# Patient Record
Sex: Female | Born: 1940 | Race: White | Hispanic: No | Marital: Married
Health system: Southern US, Academic
[De-identification: ages and names within clinical notes are randomized; demographics above are authoritative.]

## PROBLEM LIST (undated history)

## (undated) ENCOUNTER — Telehealth

## (undated) ENCOUNTER — Encounter

## (undated) ENCOUNTER — Ambulatory Visit

## (undated) ENCOUNTER — Ambulatory Visit: Payer: MEDICARE | Attending: Family | Primary: Family

## (undated) ENCOUNTER — Encounter: Attending: Family | Primary: Family

## (undated) ENCOUNTER — Ambulatory Visit: Payer: MEDICARE

## (undated) ENCOUNTER — Institutional Professional Consult (permissible substitution): Payer: MEDICARE | Attending: Family | Primary: Family

## (undated) ENCOUNTER — Telehealth: Attending: Family | Primary: Family

## (undated) ENCOUNTER — Encounter: Attending: Hematology & Oncology | Primary: Hematology & Oncology

## (undated) ENCOUNTER — Institutional Professional Consult (permissible substitution): Payer: MEDICARE

## (undated) ENCOUNTER — Telehealth
Attending: Pharmacist Clinician (PhC)/ Clinical Pharmacy Specialist | Primary: Pharmacist Clinician (PhC)/ Clinical Pharmacy Specialist

## (undated) DIAGNOSIS — G459 Transient cerebral ischemic attack, unspecified: Secondary | ICD-10-CM

## (undated) DIAGNOSIS — T753XXA Motion sickness, initial encounter: Secondary | ICD-10-CM

## (undated) DIAGNOSIS — R42 Dizziness and giddiness: Secondary | ICD-10-CM

## (undated) DIAGNOSIS — M1990 Unspecified osteoarthritis, unspecified site: Secondary | ICD-10-CM

## (undated) DIAGNOSIS — K759 Inflammatory liver disease, unspecified: Secondary | ICD-10-CM

## (undated) DIAGNOSIS — Z972 Presence of dental prosthetic device (complete) (partial): Secondary | ICD-10-CM

## (undated) DIAGNOSIS — I10 Essential (primary) hypertension: Secondary | ICD-10-CM

## (undated) HISTORY — PX: ABDOMINAL HYSTERECTOMY: SHX81

## (undated) HISTORY — PX: TONSILLECTOMY: SUR1361

## (undated) MED ORDER — CHOLECALCIFEROL (VITAMIN D3) 50 MCG (2,000 UNIT) CAPSULE: ORAL | 0 days

## (undated) MED ORDER — CETIRIZINE 10 MG TABLET: Freq: Every day | ORAL | 0 days

## (undated) MED ORDER — IBUPROFEN 600 MG TABLET: Freq: Two times a day (BID) | ORAL | 0 days

---
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
## 2007-03-03 ENCOUNTER — Ambulatory Visit: Payer: Self-pay | Admitting: Family Medicine

## 2007-07-30 ENCOUNTER — Ambulatory Visit: Payer: Self-pay | Admitting: Family Medicine

## 2009-12-21 ENCOUNTER — Emergency Department: Payer: Self-pay | Admitting: Emergency Medicine

## 2011-11-11 ENCOUNTER — Emergency Department: Payer: Self-pay | Admitting: Emergency Medicine

## 2011-11-11 LAB — URINALYSIS, COMPLETE
Blood: NEGATIVE
Glucose,UR: NEGATIVE mg/dL (ref 0–75)
Leukocyte Esterase: NEGATIVE
Nitrite: NEGATIVE
Protein: 100
WBC UR: 4 /HPF (ref 0–5)

## 2011-11-11 LAB — COMPREHENSIVE METABOLIC PANEL
Alkaline Phosphatase: 48 U/L — ABNORMAL LOW (ref 50–136)
Bilirubin,Total: 0.5 mg/dL (ref 0.2–1.0)
Co2: 23 mmol/L (ref 21–32)
Creatinine: 0.57 mg/dL — ABNORMAL LOW (ref 0.60–1.30)
EGFR (African American): >60
EGFR (Non-African Amer.): >60
SGOT(AST): 49 U/L — ABNORMAL HIGH (ref 15–37)
SGPT (ALT): 56 U/L (ref 12–78)
Sodium: 139 mmol/L (ref 136–145)
Total Protein: 7 g/dL (ref 6.4–8.2)

## 2011-11-11 LAB — CBC
HCT: 44.6 % (ref 35.0–47.0)
MCH: 33.5 pg (ref 26.0–34.0)
MCV: 98 fL (ref 80–100)
Platelet: 229 10*3/uL (ref 150–440)
RBC: 4.57 10*6/uL (ref 3.80–5.20)
RDW: 13.9 % (ref 11.5–14.5)

## 2011-11-11 LAB — TROPONIN I: Troponin-I: <0.02 ng/mL

## 2013-03-15 ENCOUNTER — Emergency Department: Payer: Self-pay | Admitting: Emergency Medicine

## 2013-03-15 LAB — CBC
HCT: 51.6 % — ABNORMAL HIGH (ref 35.0–47.0)
HGB: 16.7 g/dL — ABNORMAL HIGH (ref 12.0–16.0)
MCH: 31.1 pg (ref 26.0–34.0)
MCHC: 32.3 g/dL (ref 32.0–36.0)
Platelet: 197 10*3/uL (ref 150–440)
RBC: 5.37 10*6/uL — ABNORMAL HIGH (ref 3.80–5.20)
RDW: 15.4 % — ABNORMAL HIGH (ref 11.5–14.5)

## 2013-03-15 LAB — BASIC METABOLIC PANEL
Anion Gap: 10 (ref 7–16)
BUN: 10 mg/dL (ref 7–18)
Calcium, Total: 9.2 mg/dL (ref 8.5–10.1)
Chloride: 106 mmol/L (ref 98–107)
Co2: 24 mmol/L (ref 21–32)
Creatinine: 0.54 mg/dL — ABNORMAL LOW (ref 0.60–1.30)
EGFR (African American): >60
Osmolality: 278 (ref 275–301)
Potassium: 4 mmol/L (ref 3.5–5.1)

## 2013-03-23 IMAGING — CT CT ABD-PELV W/ CM
1 of 2 series · 15 of 32 positions shown, 19 images · non-contrast
Comparison: none

REASON FOR EXAM: (1) pain, vomiting; (2) pain, vomiting
COMMENTS:

PROCEDURE:     CT  - CT ABDOMEN / PELVIS  W  - November 12, 2011  [DATE]
RESULT:
TECHNIQUE: Helical 3 mm sections were obtained from the lung bases through
the pubic symphysis.

[Series 2: 3mm soft tissue · axial · 0.73mm/px · z∈[-1003,-580]mm · 15 of 155 slices shown, 19 images]
[im 7/155  soft-tissue]
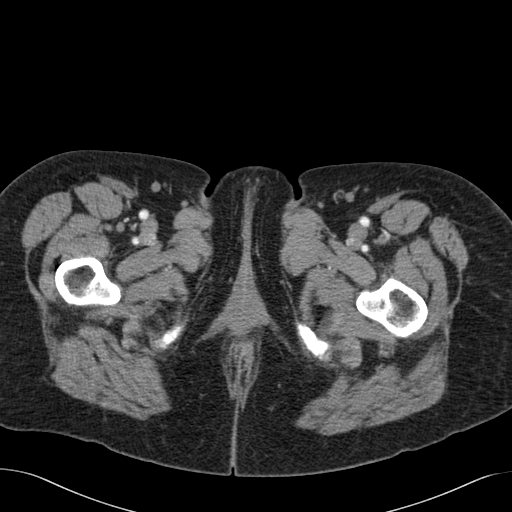
[im 7/155  bone]
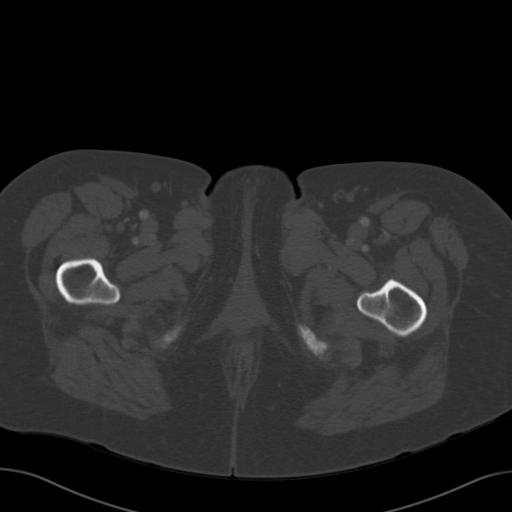
[im 21/155  soft-tissue]
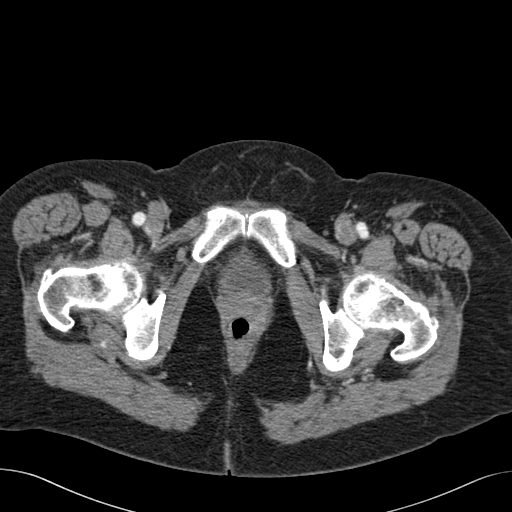
[im 34/155  soft-tissue]
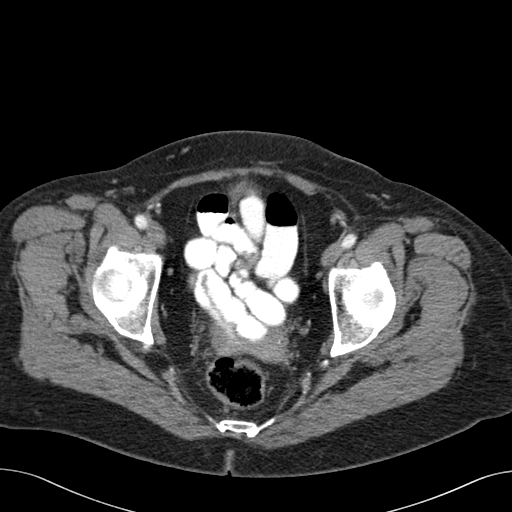
[im 41/155  soft-tissue]
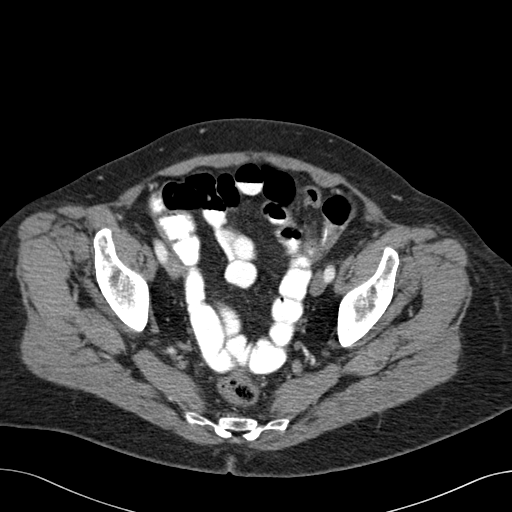
[im 54/155  soft-tissue]
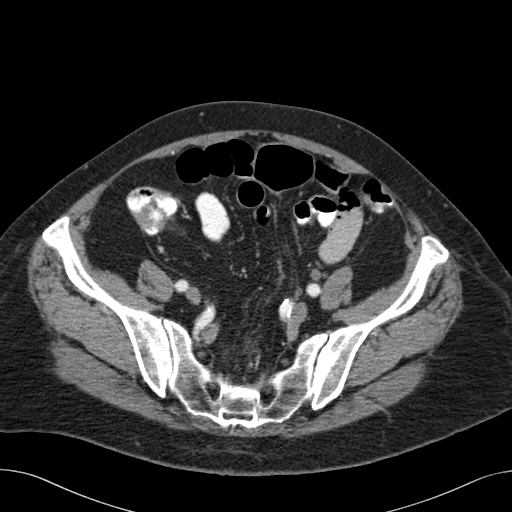
[im 67/155  soft-tissue]
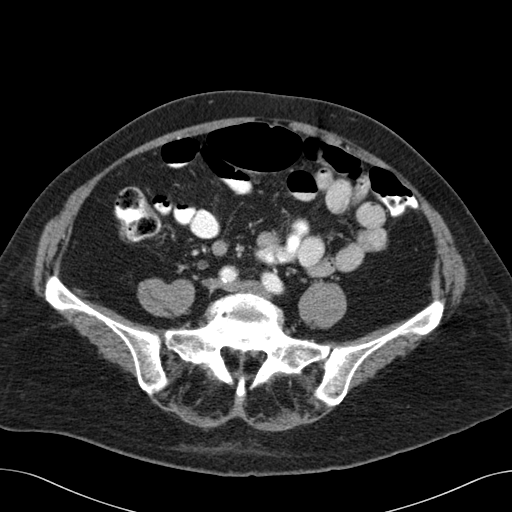
[im 81/155  soft-tissue]
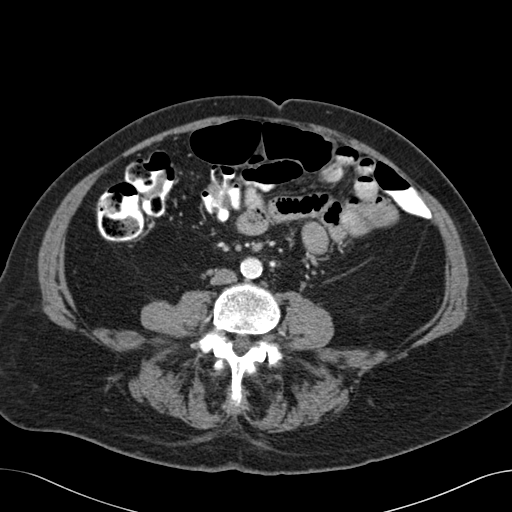
[im 88/155  soft-tissue]
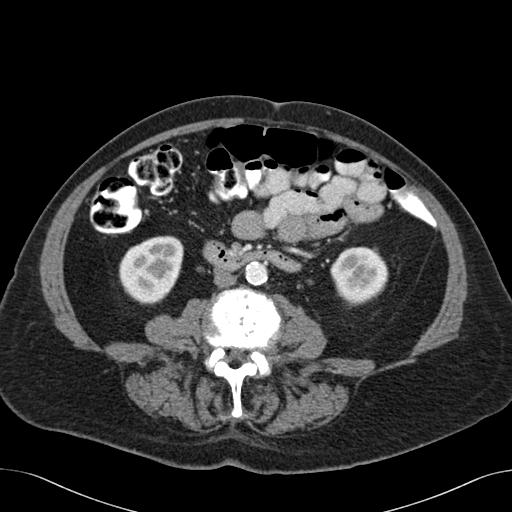
[im 101/155  soft-tissue]
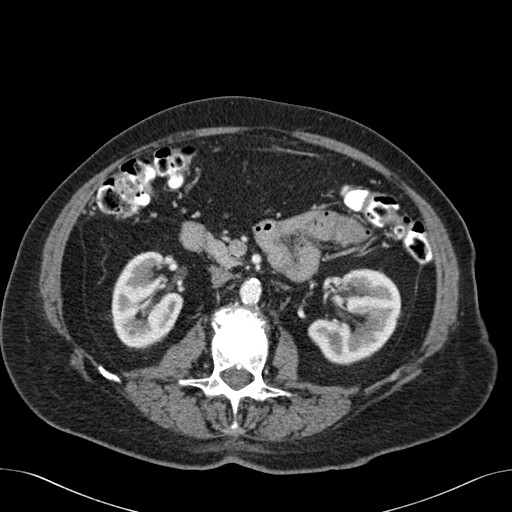
[im 101/155  bone]
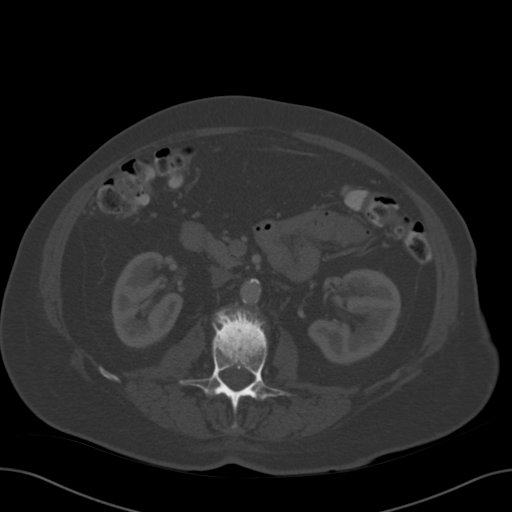
[im 114/155  soft-tissue]
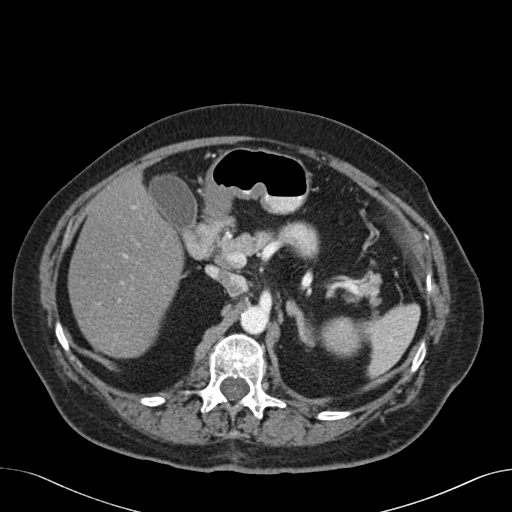
[im 121/155  soft-tissue]
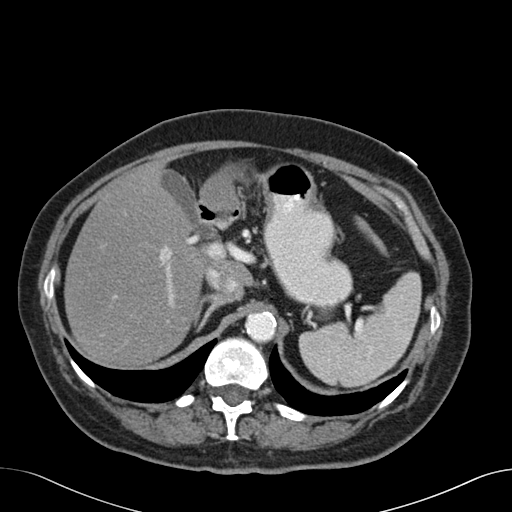
[im 128/155  lung]
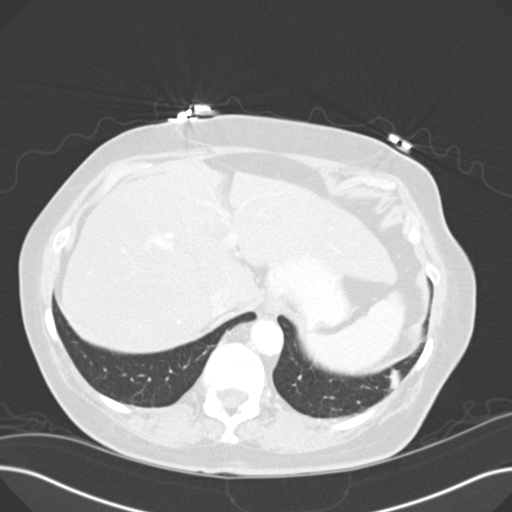
[im 134/155  soft-tissue]
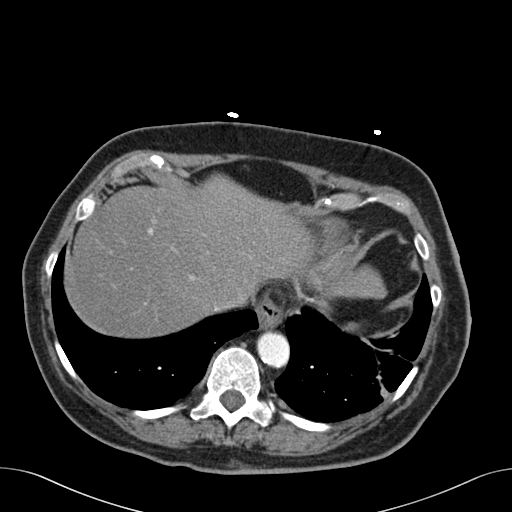
[im 134/155  lung]
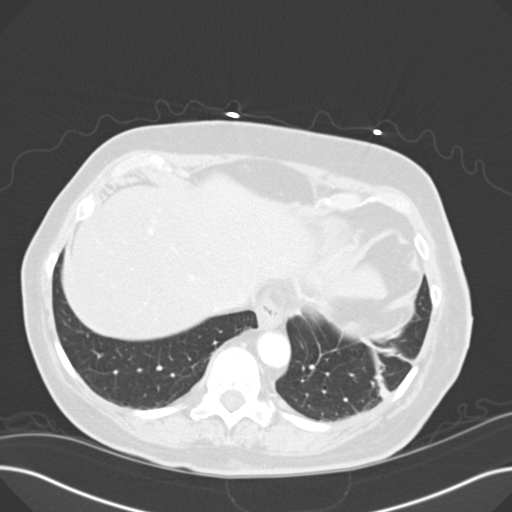
[im 141/155  lung]
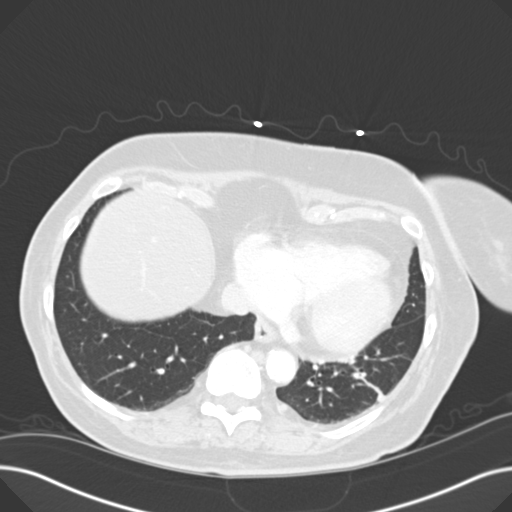
[im 148/155  soft-tissue]
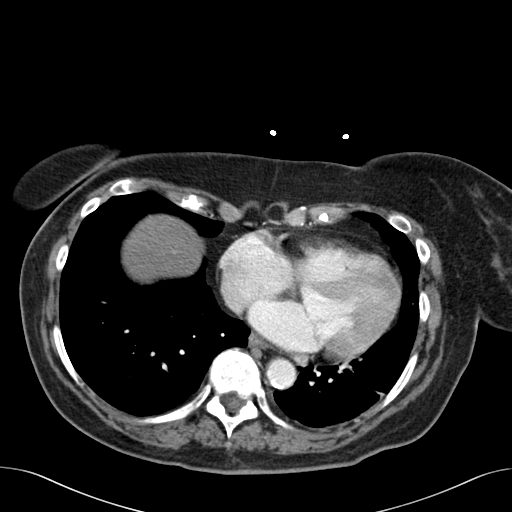
[im 148/155  lung]
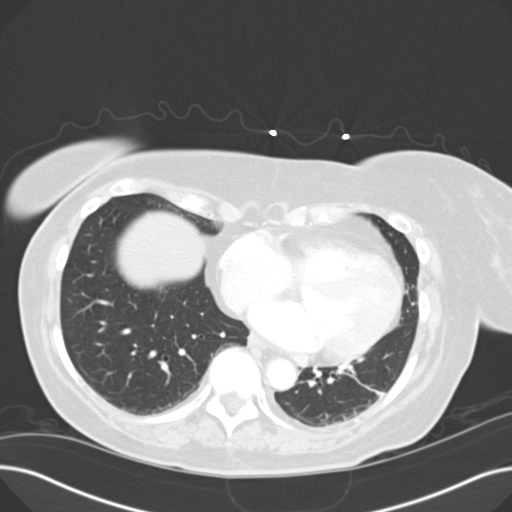

[15 of 32 positions shown; findings below may reference images not displayed]

FINDINGS: Evaluation of the lung bases demonstrates linear areas of
increased density likely representing areas of atelectasis and/or fibrosis.

There is diffuse low attenuation within the liver and is otherwise
unremarkable. The spleen, adrenals, pancreas, and kidneys are unremarkable.

Evaluation of the abdomen demonstrates diffuse wall thickening of the
gastric antrum. There does not appear to be significant stranding within the
surrounding mesenteric fat or evidence of a focal mass.

There is no CT evidence of bowel obstruction, enteritis, colitis,
diverticulitis or appendicitis. There is no evidence of an abdominal aortic
aneurysm. The celiac, SMA, IMA, portal vein, and SMV are opacified.
IMPRESSION: 1.  Findings which may represent the sequela of gastritis involving the
gastric antral region. Further evaluation with direct visualization is
recommended, if and as clinically warranted, status post appropriate
therapeutic regiment.
2.  Small hiatal hernia.
3.  There is otherwise no evidence of further obstructive or inflammatory
abnormalities.
4.  Dr. Rksras of the Emergency Department was informed of these findings
via a preliminary faxed report.

## 2013-09-10 MED ORDER — FOSINOPRIL 10 MG TABLET
Freq: Every day | ORAL | 0.00000 days
Start: 2013-09-10 — End: 2020-11-02

## 2014-07-25 IMAGING — CT CT HEAD WITHOUT CONTRAST
1 series · 16 of 30 positions shown, 20 images · non-contrast
Comparison: 11/11/2011.

CLINICAL DATA: Pain.  Headache.

EXAM:
CT HEAD WITHOUT CONTRAST
TECHNIQUE: Contiguous axial images were obtained from the base of the skull
through the vertex without intravenous contrast.

[Series 2: head wo · axial · 0.43mm/px · z∈[-60,+75]mm · 16 of 34 slices shown, 20 images]
[im 2/34  brain]
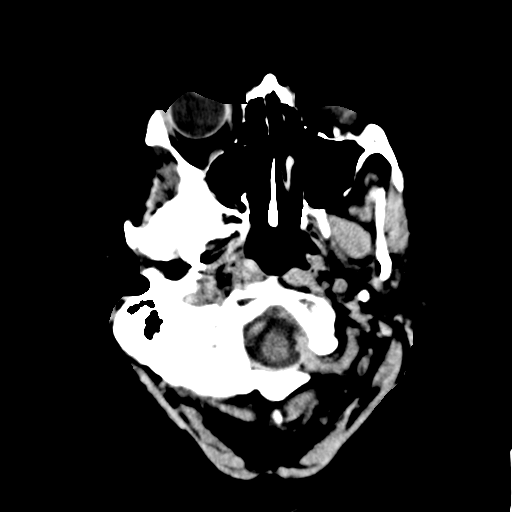
[im 2/34  bone]
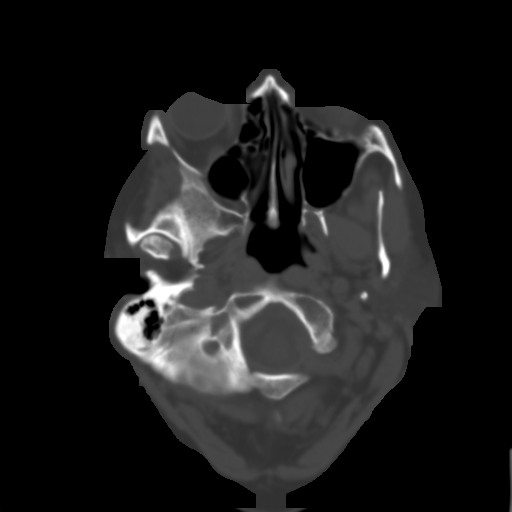
[im 4/34  brain]
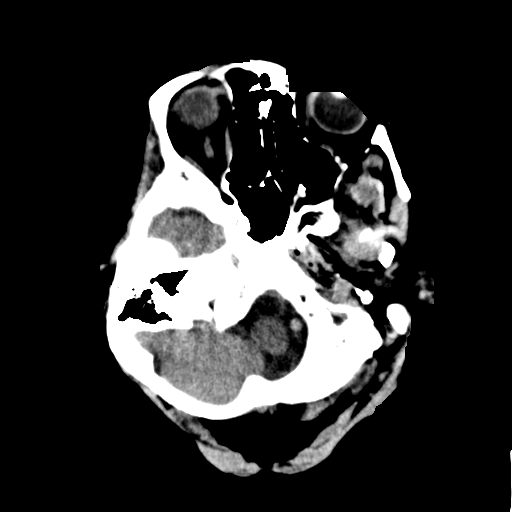
[im 6/34  brain]
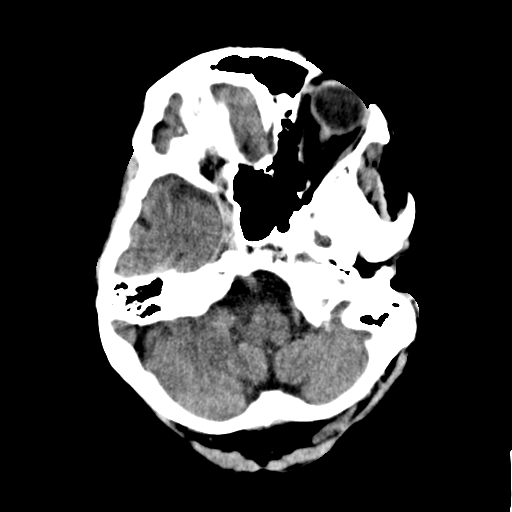
[im 8/34  brain]
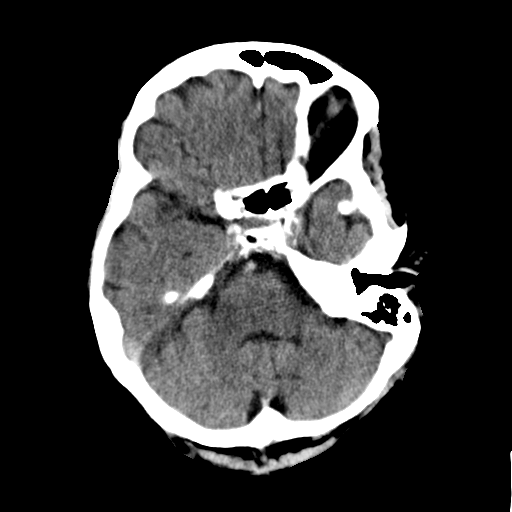
[im 10/34  brain]
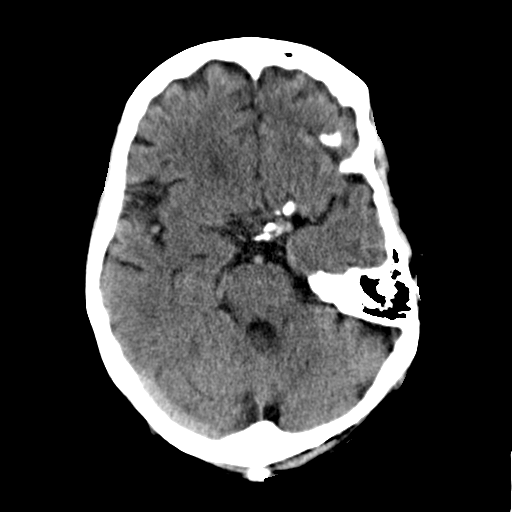
[im 10/34  bone]
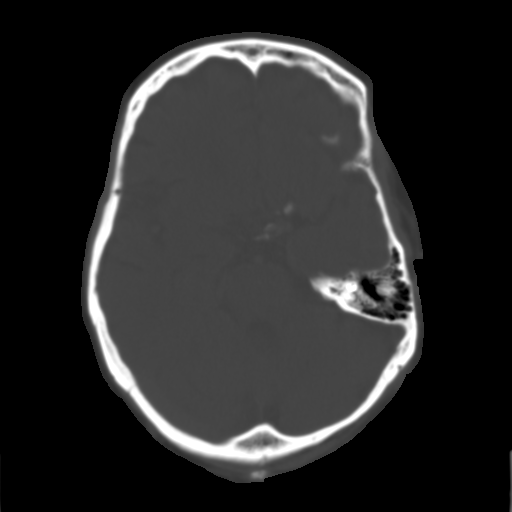
[im 12/34  brain]
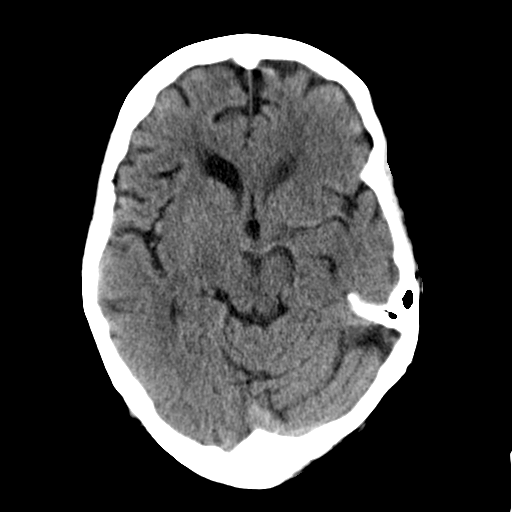
[im 14/34  brain]
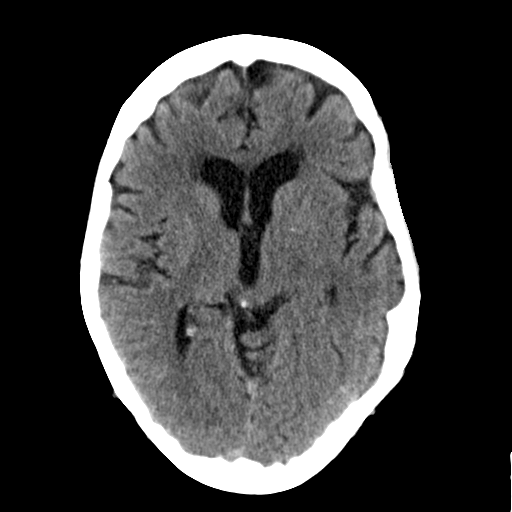
[im 16/34  brain]
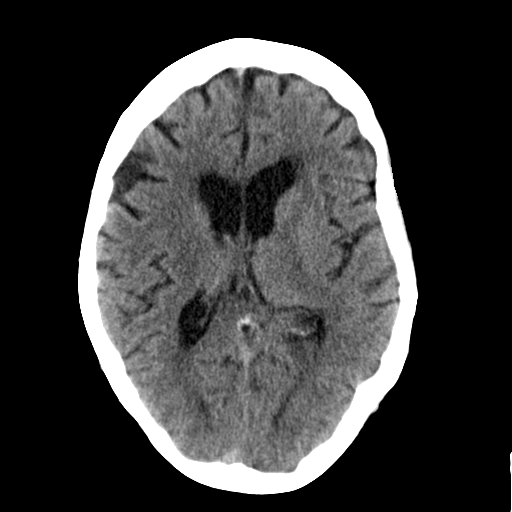
[im 18/34  brain]
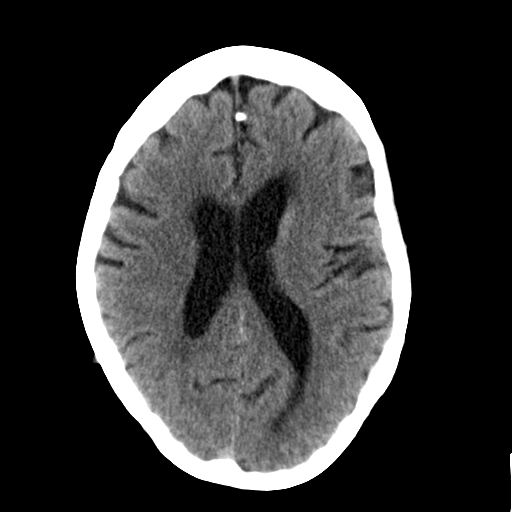
[im 18/34  bone]
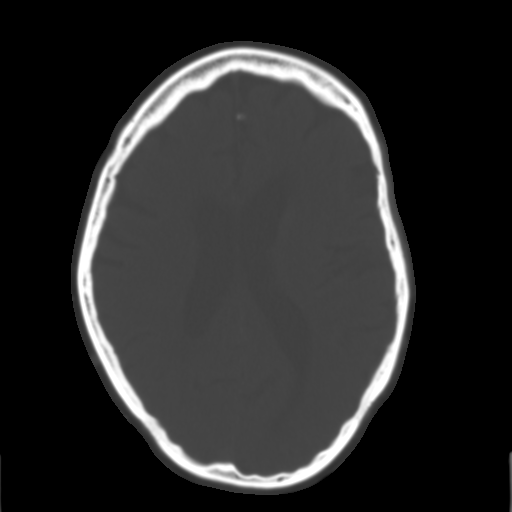
[im 20/34  brain]
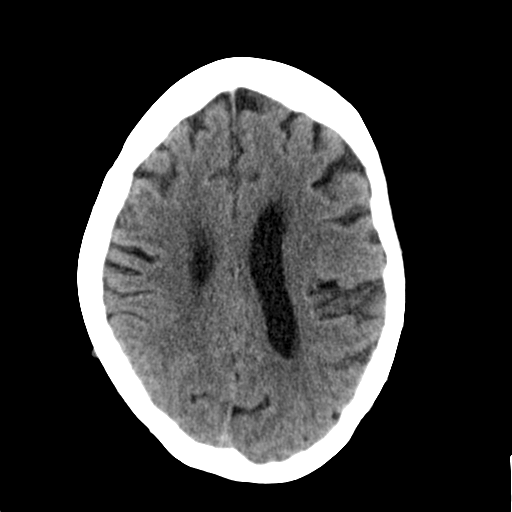
[im 22/34  brain]
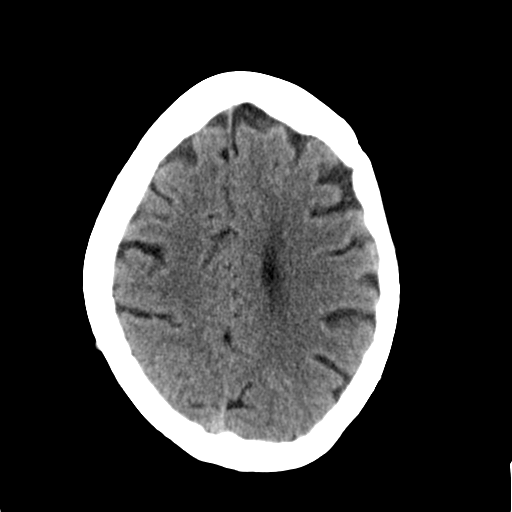
[im 24/34  brain]
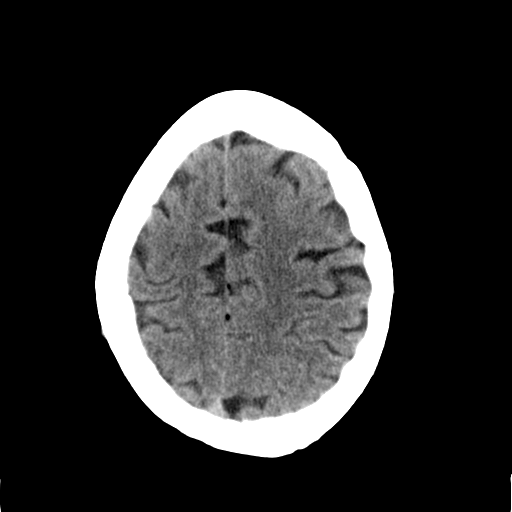
[im 26/34  brain]
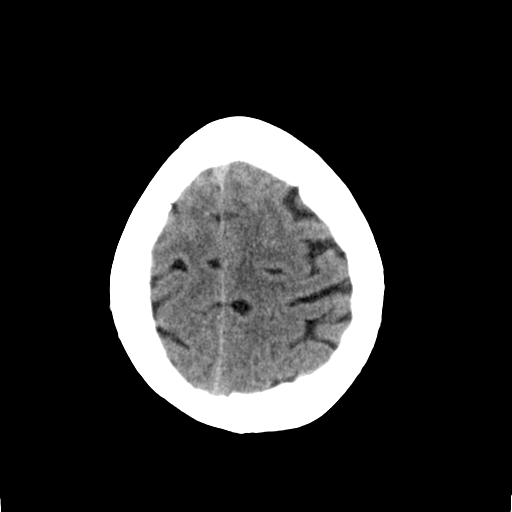
[im 26/34  bone]
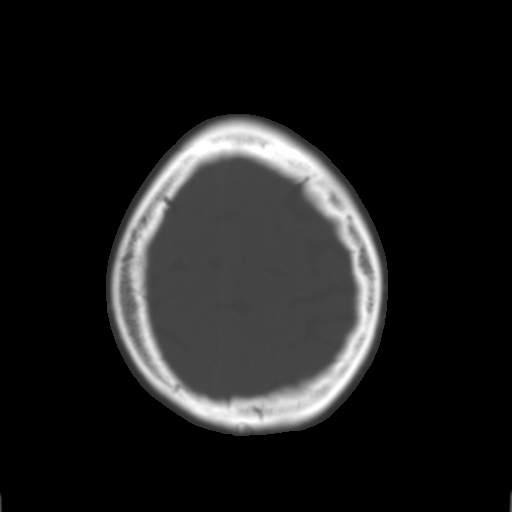
[im 28/34  brain]
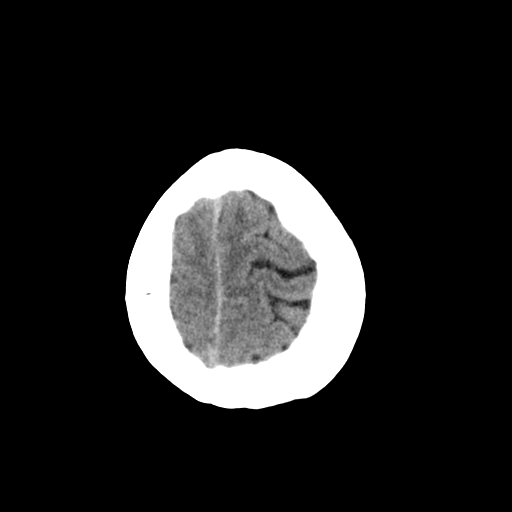
[im 30/34  brain]
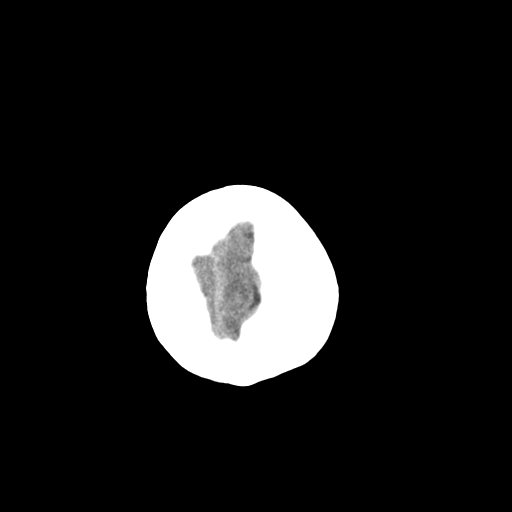
[im 32/34  brain]
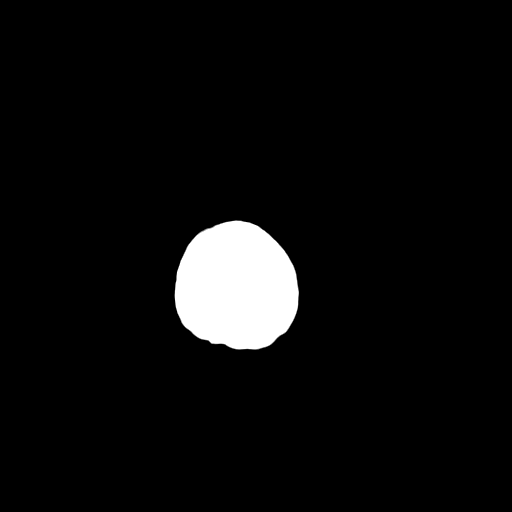

[16 of 30 positions shown; findings below may reference images not displayed]

FINDINGS: No mass. No hydrocephalus. No hemorrhage. Tiny amount of fat again
noted in the interhemispheric fissure. This is of no clinical
significance. No acute bony abnormality identified.
IMPRESSION: Negative exam.

## 2015-05-03 DIAGNOSIS — G459 Transient cerebral ischemic attack, unspecified: Secondary | ICD-10-CM

## 2015-05-03 HISTORY — DX: Transient cerebral ischemic attack, unspecified: G45.9

## 2015-07-13 DIAGNOSIS — Z8673 Personal history of transient ischemic attack (TIA), and cerebral infarction without residual deficits: Secondary | ICD-10-CM

## 2015-07-13 DIAGNOSIS — I10 Essential (primary) hypertension: Secondary | ICD-10-CM | POA: Diagnosis present

## 2015-08-09 DIAGNOSIS — B182 Chronic viral hepatitis C: Secondary | ICD-10-CM | POA: Diagnosis present

## 2015-12-08 NOTE — Discharge Instructions (Signed)

## 2015-12-14 ENCOUNTER — Encounter: Payer: Self-pay | Admitting: *Deleted

## 2015-12-18 ENCOUNTER — Ambulatory Visit: Payer: Medicare Other | Admitting: Anesthesiology

## 2015-12-18 ENCOUNTER — Ambulatory Visit
Admission: RE | Admit: 2015-12-18 | Discharge: 2015-12-18 | Disposition: A | Discharge status: Home / Self Care | Payer: Medicare Other | Source: Ambulatory Visit | Attending: Ophthalmology | Admitting: Ophthalmology

## 2015-12-18 ENCOUNTER — Encounter
Admission: RE | Disposition: A | Discharge status: Home / Self Care | Payer: Self-pay | Source: Ambulatory Visit | Attending: Ophthalmology

## 2015-12-18 DIAGNOSIS — Z79899 Other long term (current) drug therapy: Secondary | ICD-10-CM | POA: Insufficient documentation

## 2015-12-18 DIAGNOSIS — Z8673 Personal history of transient ischemic attack (TIA), and cerebral infarction without residual deficits: Secondary | ICD-10-CM | POA: Insufficient documentation

## 2015-12-18 DIAGNOSIS — Z791 Long term (current) use of non-steroidal anti-inflammatories (NSAID): Secondary | ICD-10-CM | POA: Diagnosis not present

## 2015-12-18 DIAGNOSIS — I10 Essential (primary) hypertension: Secondary | ICD-10-CM | POA: Insufficient documentation

## 2015-12-18 DIAGNOSIS — E7800 Pure hypercholesterolemia, unspecified: Secondary | ICD-10-CM | POA: Insufficient documentation

## 2015-12-18 DIAGNOSIS — Z7982 Long term (current) use of aspirin: Secondary | ICD-10-CM | POA: Insufficient documentation

## 2015-12-18 DIAGNOSIS — M1990 Unspecified osteoarthritis, unspecified site: Secondary | ICD-10-CM | POA: Diagnosis not present

## 2015-12-18 DIAGNOSIS — H2511 Age-related nuclear cataract, right eye: Secondary | ICD-10-CM | POA: Diagnosis present

## 2015-12-18 HISTORY — DX: Unspecified osteoarthritis, unspecified site: M19.90

## 2015-12-18 HISTORY — DX: Motion sickness, initial encounter: T75.3XXA

## 2015-12-18 HISTORY — DX: Inflammatory liver disease, unspecified: K75.9

## 2015-12-18 HISTORY — DX: Transient cerebral ischemic attack, unspecified: G45.9

## 2015-12-18 HISTORY — DX: Presence of dental prosthetic device (complete) (partial): Z97.2

## 2015-12-18 HISTORY — DX: Dizziness and giddiness: R42

## 2015-12-18 HISTORY — PX: CATARACT EXTRACTION W/PHACO: SHX586

## 2015-12-18 HISTORY — DX: Essential (primary) hypertension: I10

## 2015-12-18 SURGERY — PHACOEMULSIFICATION, CATARACT, WITH IOL INSERTION
Anesthesia: Monitor Anesthesia Care | Laterality: Right | Wound class: Clean

## 2015-12-18 MED ORDER — LIDOCAINE HCL (PF) 4 % IJ SOLN
INTRAOCULAR | Status: DC | PRN
  Administered 2015-12-18: 1 mL via OPHTHALMIC

## 2015-12-18 MED ORDER — POVIDONE-IODINE 5 % OP SOLN
1.0000 "application " | OPHTHALMIC | Status: DC | PRN
  Administered 2015-12-18: 1 via OPHTHALMIC

## 2015-12-18 MED ORDER — MIDAZOLAM HCL 2 MG/2ML IJ SOLN
INTRAMUSCULAR | Status: DC | PRN
  Administered 2015-12-18: 2 mg via INTRAVENOUS

## 2015-12-18 MED ORDER — EPINEPHRINE HCL 1 MG/ML IJ SOLN
INTRAOCULAR | Status: DC | PRN
  Administered 2015-12-18: 250 mL via OPHTHALMIC

## 2015-12-18 MED ORDER — BRIMONIDINE TARTRATE 0.2 % OP SOLN
OPHTHALMIC | Status: DC | PRN
  Administered 2015-12-18: 1 [drp] via OPHTHALMIC

## 2015-12-18 MED ORDER — TIMOLOL MALEATE 0.5 % OP SOLN
OPHTHALMIC | Status: DC | PRN
  Administered 2015-12-18: 1 [drp] via OPHTHALMIC

## 2015-12-18 MED ORDER — ARMC OPHTHALMIC DILATING GEL
1.0000 "application " | OPHTHALMIC | Status: DC | PRN
  Administered 2015-12-18 (×2): 1 via OPHTHALMIC

## 2015-12-18 MED ORDER — CEFUROXIME OPHTHALMIC INJECTION 1 MG/0.1 ML
INJECTION | OPHTHALMIC | Status: DC | PRN
  Administered 2015-12-18: 0.1 mL via INTRACAMERAL

## 2015-12-18 MED ORDER — FENTANYL CITRATE (PF) 100 MCG/2ML IJ SOLN
INTRAMUSCULAR | Status: DC | PRN
  Administered 2015-12-18 (×2): 50 ug via INTRAVENOUS

## 2015-12-18 MED ORDER — NA HYALUR & NA CHOND-NA HYALUR 0.4-0.35 ML IO KIT
PACK | INTRAOCULAR | Status: DC | PRN
  Administered 2015-12-18: 1 mL via INTRAOCULAR

## 2015-12-18 MED ORDER — TETRACAINE HCL 0.5 % OP SOLN
1.0000 [drp] | OPHTHALMIC | Status: DC | PRN
  Administered 2015-12-18: 1 [drp] via OPHTHALMIC

## 2015-12-18 SURGICAL SUPPLY — 29 items
APPLICATOR COTTON TIP 3IN (MISCELLANEOUS) ×3 IMPLANT
CANNULA ANT/CHMB 27GA (MISCELLANEOUS) ×3 IMPLANT
DISSECTOR HYDRO NUCLEUS 50X22 (MISCELLANEOUS) ×3 IMPLANT
GLOVE BIO SURGEON STRL SZ7 (GLOVE) ×3 IMPLANT
GLOVE SURG LX 6.5 MICRO (GLOVE) ×2
GLOVE SURG LX STRL 6.5 MICRO (GLOVE) ×1 IMPLANT
GOWN STRL REUS W/ TWL LRG LVL3 (GOWN DISPOSABLE) ×2 IMPLANT
GOWN STRL REUS W/TWL LRG LVL3 (GOWN DISPOSABLE) ×4
LENS IOL ACRSF IQ ULTRA 18.0 (Intraocular Lens) ×1 IMPLANT
LENS IOL ACRYSOF IQ 18.0 (Intraocular Lens) ×3 IMPLANT
MARKER SKIN DUAL TIP RULER LAB (MISCELLANEOUS) ×3 IMPLANT
NEEDLE FILTER BLUNT 18X 1/2SAF (NEEDLE) ×2
NEEDLE FILTER BLUNT 18X1 1/2 (NEEDLE) ×1 IMPLANT
PACK CATARACT BRASINGTON (MISCELLANEOUS) ×3 IMPLANT
PACK EYE AFTER SURG (MISCELLANEOUS) ×3 IMPLANT
PACK OPTHALMIC (MISCELLANEOUS) ×3 IMPLANT
RING MALYGIN 7.0 (MISCELLANEOUS) IMPLANT
SOL BAL SALT 15ML (MISCELLANEOUS)
SOLUTION BAL SALT 15ML (MISCELLANEOUS) IMPLANT
SUT ETHILON 10-0 CS-B-6CS-B-6 (SUTURE)
SUT VICRYL  9 0 (SUTURE)
SUT VICRYL 9 0 (SUTURE) IMPLANT
SUTURE EHLN 10-0 CS-B-6CS-B-6 (SUTURE) IMPLANT
SYR 3ML LL SCALE MARK (SYRINGE) ×3 IMPLANT
SYR TB 1ML LUER SLIP (SYRINGE) ×3 IMPLANT
WATER STERILE IRR 250ML POUR (IV SOLUTION) ×3 IMPLANT
WATER STERILE IRR 500ML POUR (IV SOLUTION) IMPLANT
WICK EYE OCUCEL (MISCELLANEOUS) IMPLANT
WIPE NON LINTING 3.25X3.25 (MISCELLANEOUS) ×3 IMPLANT

## 2015-12-18 NOTE — Transfer of Care (Signed)
Immediate Anesthesia Transfer of Care Note  Patient: Alyssa RollsMarjory A Pitts  Procedure(s) Performed: Procedure(s) with comments: CATARACT EXTRACTION PHACO AND INTRAOCULAR LENS PLACEMENT (IOC) (Right) - RIGHT  Patient Location: PACU  Anesthesia Type: MAC  Level of Consciousness: awake, alert  and patient cooperative  Airway and Oxygen Therapy: Patient Spontanous Breathing and Patient connected to supplemental oxygen  Post-op Assessment: Post-op Vital signs reviewed, Patient's Cardiovascular Status Stable, Respiratory Function Stable, Patent Airway and No signs of Nausea or vomiting  Post-op Vital Signs: Reviewed and stable  Complications: No apparent anesthesia complications

## 2015-12-18 NOTE — H&P (Signed)
H+P reviewed and is up to date, please see paper chart.  

## 2015-12-18 NOTE — Op Note (Signed)
Date of Surgery: 12/18/2015  PREOPERATIVE DIAGNOSES: Visually significant nuclear sclerotic cataract, right eye.  POSTOPERATIVE DIAGNOSES: Same  PROCEDURES PERFORMED: Cataract extraction with intraocular lens implant, right eye.  SURGEON: Devin GoingAnita P. Vin, M.D.  ANESTHESIA: MAC and topical  IMPLANTS: [id number] +18.0 D  Implant Name Type Inv. Item Serial No. Manufacturer Lot No. LRB No. Used  LENS IOL ACRYSOF IQ 18.0 - [id number] Intraocular Lens LENS IOL ACRYSOF IQ 18.0 [id number] ALCON   Right 1     COMPLICATIONS: None.  DESCRIPTION OF PROCEDURE: Therapeutic options were discussed with the patient preoperatively, including a discussion of risks and benefits of surgery. Informed consent was obtained. An IOL-Master and immersion biometry were used to take the lens measurements, and a dilated fundus exam was performed within 6 months of the surgical date.  The patient was premedicated and brought to the operating room and placed on the operating table in the supine position. After adequate anesthesia, the patient was prepped and draped in the usual sterile ophthalmic fashion. A wire lid speculum was inserted and the microscope was positioned. A Superblade was used to create a paracentesis site at the limbus and a small amount of dilute preservative free lidocaine was instilled into the anterior chamber, followed by dispersive viscoelastic. A clear corneal incision was created temporally using a 2.4 mm keratome blade. Capsulorrhexis was then performed. In situ phacoemulsification was performed.  Cortical material was removed with the irrigation-aspiration unit. Dispersive viscoelastic was instilled to open the capsular bag. A posterior chamber intraocular lens with the specifications above was inserted and positioned. Irrigation-aspiration was used to remove all viscoelastic. Cefuroxime 1cc was instilled into the anterior chamber, and the corneal incision was checked and found to be water tight.  The eyelid speculum was removed.  The operative eye was covered with protective goggles after instilling 1 drop of timolol and brimonidine. The patient tolerated the procedure well. There were no complications.

## 2015-12-18 NOTE — Anesthesia Preprocedure Evaluation (Addendum)
Anesthesia Evaluation  Patient identified by MRN, date of birth, ID band Patient awake    Reviewed: Allergy & Precautions, H&P , NPO status , Patient's Chart, lab work & pertinent test results, reviewed documented beta blocker date and time   Airway Mallampati: II  TM Distance: >3 FB Neck ROM: full    Dental no notable dental hx.  Permanent upper bridge:   Pulmonary neg pulmonary ROS,    Pulmonary exam normal breath sounds clear to auscultation       Cardiovascular Exercise Tolerance: Good hypertension, negative cardio ROS   Rhythm:regular Rate:Normal     Neuro/Psych TIAnegative neurological ROS  negative psych ROS   GI/Hepatic negative GI ROS, Neg liver ROS, (+) Hepatitis -, C  Endo/Other  negative endocrine ROS  Renal/GU negative Renal ROS  negative genitourinary   Musculoskeletal   Abdominal   Peds  Hematology negative hematology ROS (+)   Anesthesia Other Findings   Reproductive/Obstetrics negative OB ROS                            Anesthesia Physical Anesthesia Plan  ASA: II  Anesthesia Plan: MAC   Post-op Pain Management:    Induction:   Airway Management Planned:   Additional Equipment:   Intra-op Plan:   Post-operative Plan:   Informed Consent: I have reviewed the patients History and Physical, chart, labs and discussed the procedure including the risks, benefits and alternatives for the proposed anesthesia with the patient or authorized representative who has indicated his/her understanding and acceptance.   Dental Advisory Given  Plan Discussed with: CRNA  Anesthesia Plan Comments:         Anesthesia Quick Evaluation

## 2015-12-18 NOTE — Anesthesia Postprocedure Evaluation (Signed)
Anesthesia Post Note  Patient: Alyssa Pitts  Procedure(s) Performed: Procedure(s) (LRB): CATARACT EXTRACTION PHACO AND INTRAOCULAR LENS PLACEMENT (IOC) (Right)  Patient location during evaluation: PACU Anesthesia Type: MAC Level of consciousness: awake and alert Pain management: pain level controlled Vital Signs Assessment: post-procedure vital signs reviewed and stable Respiratory status: spontaneous breathing, nonlabored ventilation, respiratory function stable and patient connected to nasal cannula oxygen Cardiovascular status: stable and blood pressure returned to baseline Anesthetic complications: no    Scarlette Sliceachel B Seniya Stoffers

## 2015-12-18 NOTE — Anesthesia Procedure Notes (Signed)
Procedure Name: MAC Date/Time: 12/18/2015 10:17 AM Performed by: Maryan RuedWILSON, Arissa Fagin M Pre-anesthesia Checklist: Patient identified, Emergency Drugs available, Suction available and Patient being monitored Patient Re-evaluated:Patient Re-evaluated prior to inductionOxygen Delivery Method: Nasal cannula

## 2015-12-19 ENCOUNTER — Encounter: Payer: Self-pay | Admitting: Ophthalmology

## 2016-09-29 MED ORDER — LISINOPRIL 5 MG TABLET
ORAL_TABLET | 0 refills | 0 days | Status: CP
Start: 2016-09-29 — End: 2016-10-07

## 2016-09-29 MED ORDER — CITALOPRAM 20 MG TABLET
ORAL_TABLET | 2 refills | 0 days | Status: CP
Start: 2016-09-29 — End: 2016-10-07

## 2016-10-07 ENCOUNTER — Ambulatory Visit: Admission: RE | Admit: 2016-10-07 | Discharge: 2016-10-07 | Disposition: A | Discharge status: Home / Self Care

## 2016-10-07 DIAGNOSIS — I10 Essential (primary) hypertension: Principal | ICD-10-CM

## 2016-10-07 DIAGNOSIS — D7589 Other specified diseases of blood and blood-forming organs: Secondary | ICD-10-CM

## 2016-10-07 DIAGNOSIS — R635 Abnormal weight gain: Secondary | ICD-10-CM

## 2016-10-07 DIAGNOSIS — B182 Chronic viral hepatitis C: Secondary | ICD-10-CM

## 2016-10-07 DIAGNOSIS — E785 Hyperlipidemia, unspecified: Secondary | ICD-10-CM

## 2016-10-07 DIAGNOSIS — R7989 Other specified abnormal findings of blood chemistry: Secondary | ICD-10-CM

## 2016-10-07 DIAGNOSIS — R945 Abnormal results of liver function studies: Secondary | ICD-10-CM

## 2016-10-07 DIAGNOSIS — F329 Major depressive disorder, single episode, unspecified: Secondary | ICD-10-CM

## 2016-10-07 MED ORDER — ATORVASTATIN 20 MG TABLET
ORAL_TABLET | Freq: Every day | ORAL | 1 refills | 0 days | Status: CP
Start: 2016-10-07 — End: 2017-07-24

## 2016-10-07 MED ORDER — LISINOPRIL 5 MG TABLET
ORAL_TABLET | Freq: Every day | ORAL | 1 refills | 0.00000 days | Status: CP
Start: 2016-10-07 — End: 2017-07-24

## 2016-10-07 MED ORDER — CETIRIZINE 10 MG TABLET
ORAL_TABLET | Freq: Every day | ORAL | 1 refills | 0 days | Status: CP
Start: 2016-10-07 — End: 2017-05-13

## 2016-10-07 MED ORDER — BUPROPION HCL XL 150 MG 24 HR TABLET, EXTENDED RELEASE
ORAL_TABLET | Freq: Every morning | ORAL | 2 refills | 0 days | Status: CP
Start: 2016-10-07 — End: 2017-03-07

## 2016-10-07 MED ORDER — CITALOPRAM 20 MG TABLET
ORAL_TABLET | Freq: Every day | ORAL | 1 refills | 0 days | Status: CP
Start: 2016-10-07 — End: 2017-03-17

## 2016-10-07 MED ORDER — LEVOTHYROXINE 25 MCG TABLET
ORAL_TABLET | Freq: Every day | ORAL | 11 refills | 0 days | Status: CP
Start: 2016-10-07 — End: 2017-10-21

## 2016-11-29 MED ORDER — CITALOPRAM 20 MG TABLET
ORAL_TABLET | 0 refills | 0 days | Status: CP
Start: 2016-11-29 — End: 2017-03-07

## 2016-12-03 ENCOUNTER — Ambulatory Visit: Admission: RE | Admit: 2016-12-03 | Discharge: 2016-12-03 | Disposition: A | Discharge status: Home / Self Care

## 2016-12-03 DIAGNOSIS — Z8619 Personal history of other infectious and parasitic diseases: Secondary | ICD-10-CM

## 2016-12-03 DIAGNOSIS — F329 Major depressive disorder, single episode, unspecified: Secondary | ICD-10-CM

## 2016-12-03 DIAGNOSIS — E039 Hypothyroidism, unspecified: Principal | ICD-10-CM

## 2016-12-03 DIAGNOSIS — R945 Abnormal results of liver function studies: Secondary | ICD-10-CM

## 2016-12-03 MED ORDER — VARICELLA-ZOSTER GLYCOE VACC-AS01B ADJ(PF) 50 MCG/0.5 ML IM SUSP, KIT
Freq: Once | INTRAMUSCULAR | 0 refills | 0.00000 days | Status: CP
Start: 2016-12-03 — End: 2016-12-03

## 2017-03-07 ENCOUNTER — Ambulatory Visit: Admission: RE | Admit: 2017-03-07 | Discharge: 2017-03-07 | Disposition: A | Discharge status: Home / Self Care

## 2017-03-07 DIAGNOSIS — B182 Chronic viral hepatitis C: Principal | ICD-10-CM

## 2017-03-07 DIAGNOSIS — Z1211 Encounter for screening for malignant neoplasm of colon: Secondary | ICD-10-CM

## 2017-03-07 DIAGNOSIS — Z1231 Encounter for screening mammogram for malignant neoplasm of breast: Secondary | ICD-10-CM

## 2017-03-07 DIAGNOSIS — E785 Hyperlipidemia, unspecified: Secondary | ICD-10-CM

## 2017-03-07 DIAGNOSIS — I10 Essential (primary) hypertension: Secondary | ICD-10-CM

## 2017-03-07 DIAGNOSIS — Z130 Encounter for screening for diseases of the blood and blood-forming organs and certain disorders involving the immune mechanism: Secondary | ICD-10-CM

## 2017-03-07 DIAGNOSIS — R296 Repeated falls: Secondary | ICD-10-CM

## 2017-03-07 DIAGNOSIS — Z0000 Encounter for general adult medical examination without abnormal findings: Secondary | ICD-10-CM

## 2017-03-07 DIAGNOSIS — R2689 Other abnormalities of gait and mobility: Secondary | ICD-10-CM

## 2017-03-07 MED ORDER — VARICELLA-ZOSTER GLYCOE VACC-AS01B ADJ(PF) 50 MCG/0.5 ML IM SUSP, KIT
Freq: Once | INTRAMUSCULAR | 0 refills | 0.00000 days | Status: CP
Start: 2017-03-07 — End: 2017-03-07

## 2017-03-17 MED ORDER — CITALOPRAM 40 MG TABLET
ORAL_TABLET | Freq: Every day | ORAL | 1 refills | 0 days | Status: CP
Start: 2017-03-17 — End: 2017-07-24

## 2017-05-13 MED ORDER — CETIRIZINE 10 MG TABLET
ORAL_TABLET | Freq: Every day | ORAL | 1 refills | 0 days | Status: CP
Start: 2017-05-13 — End: 2017-12-02

## 2017-07-25 ENCOUNTER — Ambulatory Visit: Admit: 2017-07-25 | Discharge: 2017-07-26 | Payer: MEDICARE

## 2017-07-25 DIAGNOSIS — F329 Major depressive disorder, single episode, unspecified: Secondary | ICD-10-CM

## 2017-07-25 DIAGNOSIS — E785 Hyperlipidemia, unspecified: Secondary | ICD-10-CM

## 2017-07-25 DIAGNOSIS — E039 Hypothyroidism, unspecified: Secondary | ICD-10-CM

## 2017-07-25 DIAGNOSIS — D539 Nutritional anemia, unspecified: Secondary | ICD-10-CM

## 2017-07-25 DIAGNOSIS — B1920 Unspecified viral hepatitis C without hepatic coma: Secondary | ICD-10-CM

## 2017-07-25 DIAGNOSIS — R945 Abnormal results of liver function studies: Secondary | ICD-10-CM

## 2017-07-25 DIAGNOSIS — I10 Essential (primary) hypertension: Principal | ICD-10-CM

## 2017-07-25 MED ORDER — ATORVASTATIN 20 MG TABLET
ORAL_TABLET | Freq: Every day | ORAL | 3 refills | 0.00000 days | Status: CP
Start: 2017-07-25 — End: 2018-09-21

## 2017-07-25 MED ORDER — LISINOPRIL 5 MG TABLET
ORAL_TABLET | Freq: Every day | ORAL | 3 refills | 0.00000 days | Status: CP
Start: 2017-07-25 — End: 2018-06-17

## 2017-07-25 MED ORDER — CITALOPRAM 40 MG TABLET
ORAL_TABLET | Freq: Every day | ORAL | 1 refills | 0 days | Status: CP
Start: 2017-07-25 — End: 2017-11-26

## 2017-10-22 MED ORDER — LEVOTHYROXINE 25 MCG TABLET
ORAL_TABLET | Freq: Every day | ORAL | 1 refills | 0 days | Status: CP
Start: 2017-10-22 — End: 2018-09-25

## 2017-11-24 ENCOUNTER — Ambulatory Visit: Admit: 2017-11-24 | Discharge: 2017-11-25 | Payer: MEDICARE

## 2017-11-24 DIAGNOSIS — E785 Hyperlipidemia, unspecified: Secondary | ICD-10-CM

## 2017-11-24 DIAGNOSIS — R945 Abnormal results of liver function studies: Principal | ICD-10-CM

## 2017-11-24 DIAGNOSIS — E079 Disorder of thyroid, unspecified: Secondary | ICD-10-CM

## 2017-11-24 DIAGNOSIS — D7589 Other specified diseases of blood and blood-forming organs: Secondary | ICD-10-CM

## 2017-11-24 DIAGNOSIS — I10 Essential (primary) hypertension: Secondary | ICD-10-CM

## 2017-11-24 DIAGNOSIS — F329 Major depressive disorder, single episode, unspecified: Secondary | ICD-10-CM

## 2017-11-24 DIAGNOSIS — B182 Chronic viral hepatitis C: Secondary | ICD-10-CM

## 2017-11-24 DIAGNOSIS — F32A Depression, unspecified: Secondary | ICD-10-CM | POA: Diagnosis present

## 2017-11-27 MED ORDER — CITALOPRAM 40 MG TABLET
ORAL_TABLET | 1 refills | 0 days | Status: CP
Start: 2017-11-27 — End: 2018-09-25

## 2017-12-02 MED ORDER — CETIRIZINE 10 MG TABLET
ORAL_TABLET | Freq: Every evening | ORAL | 1 refills | 0 days | Status: CP
Start: 2017-12-02 — End: 2018-09-28

## 2018-06-17 DIAGNOSIS — I10 Essential (primary) hypertension: Principal | ICD-10-CM

## 2018-06-17 MED ORDER — LISINOPRIL 5 MG TABLET
Freq: Every day | ORAL | 1 refills | 0.00000 days | Status: CP
Start: 2018-06-17 — End: 2018-07-08

## 2018-07-08 MED ORDER — LISINOPRIL 5 MG TABLET
Freq: Every day | ORAL | 1 refills | 0 days | Status: CP
Start: 2018-07-08 — End: 2018-07-09

## 2018-07-09 MED ORDER — LISINOPRIL 5 MG TABLET
Freq: Every day | ORAL | 1 refills | 0.00000 days | Status: CP
Start: 2018-07-09 — End: 2019-07-09

## 2018-09-21 MED ORDER — ATORVASTATIN 20 MG TABLET
Freq: Every evening | ORAL | 1 refills | 0 days | Status: CP
Start: 2018-09-21 — End: 2019-09-21

## 2018-09-25 MED ORDER — LEVOTHYROXINE 25 MCG TABLET
Freq: Every day | ORAL | 1 refills | 0.00000 days | Status: CP
Start: 2018-09-25 — End: 2019-09-25

## 2018-09-25 MED ORDER — CITALOPRAM 40 MG TABLET
ORAL_TABLET | Freq: Every day | ORAL | 0 refills | 0.00000 days | Status: CP
Start: 2018-09-25 — End: 2019-09-25

## 2018-09-28 ENCOUNTER — Institutional Professional Consult (permissible substitution): Admit: 2018-09-28 | Discharge: 2018-09-29 | Payer: MEDICARE

## 2018-09-28 DIAGNOSIS — R945 Abnormal results of liver function studies: Secondary | ICD-10-CM

## 2018-09-28 DIAGNOSIS — I10 Essential (primary) hypertension: Secondary | ICD-10-CM

## 2018-09-28 DIAGNOSIS — E785 Hyperlipidemia, unspecified: Secondary | ICD-10-CM

## 2018-09-28 DIAGNOSIS — F329 Major depressive disorder, single episode, unspecified: Secondary | ICD-10-CM

## 2018-09-28 DIAGNOSIS — E079 Disorder of thyroid, unspecified: Principal | ICD-10-CM

## 2018-09-28 DIAGNOSIS — B182 Chronic viral hepatitis C: Secondary | ICD-10-CM

## 2018-09-28 DIAGNOSIS — D7589 Other specified diseases of blood and blood-forming organs: Secondary | ICD-10-CM

## 2018-10-14 ENCOUNTER — Other Ambulatory Visit: Admit: 2018-10-14 | Discharge: 2018-10-15 | Payer: MEDICARE

## 2018-10-14 DIAGNOSIS — E785 Hyperlipidemia, unspecified: Secondary | ICD-10-CM

## 2018-10-14 DIAGNOSIS — R945 Abnormal results of liver function studies: Secondary | ICD-10-CM

## 2018-10-14 DIAGNOSIS — E079 Disorder of thyroid, unspecified: Secondary | ICD-10-CM

## 2018-10-14 DIAGNOSIS — Z130 Encounter for screening for diseases of the blood and blood-forming organs and certain disorders involving the immune mechanism: Principal | ICD-10-CM

## 2018-12-15 ENCOUNTER — Telehealth: Admit: 2018-12-15 | Discharge: 2018-12-16 | Payer: MEDICARE | Attending: Family | Primary: Family

## 2018-12-15 DIAGNOSIS — K760 Fatty (change of) liver, not elsewhere classified: Secondary | ICD-10-CM

## 2018-12-15 DIAGNOSIS — Z1159 Encounter for screening for other viral diseases: Secondary | ICD-10-CM

## 2018-12-15 DIAGNOSIS — Z20828 Contact with and (suspected) exposure to other viral communicable diseases: Secondary | ICD-10-CM

## 2018-12-15 DIAGNOSIS — B182 Chronic viral hepatitis C: Secondary | ICD-10-CM

## 2018-12-23 DIAGNOSIS — F329 Major depressive disorder, single episode, unspecified: Secondary | ICD-10-CM

## 2018-12-24 MED ORDER — CITALOPRAM 40 MG TABLET
ORAL_TABLET | Freq: Every day | ORAL | 1 refills | 90 days | Status: CP
Start: 2018-12-24 — End: 2019-12-24

## 2018-12-28 ENCOUNTER — Encounter: Admit: 2018-12-28 | Discharge: 2018-12-29 | Payer: MEDICARE

## 2018-12-28 DIAGNOSIS — B182 Chronic viral hepatitis C: Secondary | ICD-10-CM

## 2018-12-28 DIAGNOSIS — K760 Fatty (change of) liver, not elsewhere classified: Secondary | ICD-10-CM

## 2019-01-12 ENCOUNTER — Ambulatory Visit: Admit: 2019-01-12 | Discharge: 2019-01-13 | Payer: MEDICARE

## 2019-01-15 DIAGNOSIS — B182 Chronic viral hepatitis C: Principal | ICD-10-CM

## 2019-01-15 MED ORDER — SOFOSBUVIR 400 MG-VELPATASVIR 100 MG TABLET: 1 | tablet | Freq: Every day | 2 refills | 28 days | Status: AC

## 2019-01-18 DIAGNOSIS — B182 Chronic viral hepatitis C: Principal | ICD-10-CM

## 2019-01-19 NOTE — Unmapped (Signed)
[facility] Specialty Medication Onboarding    Specialty Medication: SOFOSBUVIR-VELPATASVIR   Prior Authorization: Approved   Financial Assistance: No - copay  <$25  Final Copay/Day Supply: $100 / 28 days    Insurance Restrictions: None     Notes to Pharmacist: Patient approved copay per [facility] referral.     The triage team has completed the benefits investigation and has determined that the patient is able to fill this medication at [facility]. Please contact the patient to complete the onboarding or follow up with the prescribing physician as needed.

## 2019-01-19 NOTE — Unmapped (Signed)
[facility]   Patient Onboarding/Medication Counseling    Kimberly Reynolds is a 78 y.o. female with Hepatitis C who I am counseling today on initiation of therapy.  I am speaking to the patient.    Verified patient's date of birth / HIPAA.    Specialty medication(s) to be sent: Infectious Disease: sofosbuvir/velpatasvir      Non-specialty medications/supplies to be sent: n/a      Medications not needed at this time: n/a         Epclusa (sofosbuvir and velpatasvir) 400mg /100mg     Planned Start Date: 01/21/2019    Has the patient been told to call and make a 4 week follow-up appointment at the liver clinic after their medicine has been received? Yes, patient was told to call [phone], option 1, option 1      Medication & Administration     Dosage: Take one tablet by mouth daily for 12 weeks.    Administration:  ? Take with or without food.  ? Antacids: Separate the administration of Epclusa and antacids by at least 4 hours.  ? H2 Receptor Antagonist: Administer H2 receptor antagonist doses less than or comparable to famotidine 40 mg twice daily simultaneously or 12 hours apart from [place].   ? Proton Pump Inhibitor (PPI): Epclusa should be administered with food and taken 4 hours before omeprazole max dose of 20mg .      Adherence/Missed dose instructions:  ? Take missed dose as soon as you remember. If it is close to the time of your next dose, skip the missed dose and resume with your next scheduled dose.  ? Do not take extra doses or 2 doses at the same time.  ? Avoid missing doses as it can result in development of resistance.    Goals of Therapy     The goal of therapy is to cure the patient of Hepatitis C. Patients who have undetectable HCV RNA in the serum, as assessed by a sensitive polymerase chain reaction (PCR) assay, ?12 weeks after treatment completion are deemed to have achieved SVR (cure). (www.hcvguidelines.org)    Side Effects & Monitoring Parameters     Common Side Effects: ? Headache  ? Fatigue     To help minimize some of the side effects: (http://www.velazquez.com/)  ? Stay hydrated by drinking plenty of water and limiting caffeinated beverages such as coffee, tea and soda.  ? Do your hardest chores when you have the most energy.   ? Take short naps of 20 minutes or less that are and not too close to bedtime.    The following side effects should be reported to the provider:  ? Signs of liver problems like dark urine, feeling excessively tired, lack of hunger, upset stomach or stomach pain, light-colored stools, throwing up, or yellow skin/eyes.  ? Signs of an allergic reaction, such as rash; hives; itching; red, swollen, blistered, or peeling skin with or without fever. If you have wheezing; tightness in the chest or throat; trouble breathing, swallowing, or talking; unusual hoarseness; or swelling of the mouth, face, lips, tongue, or throat, call 911 or go to the closest emergency department (ED).     Monitoring Parameters: (AASLD-IDSA Guidelines, 2019)    Within 6 months prior to starting treatment:  ? Complete blood count (CBC).  ? International normalized ratio (INR) if clinically indicated.   ? Hepatic function panel: albumin, total and direct bilirubin, ALT, AST, and Alkaline Phosphatase levels.  ? Calculated glomerular filtration rate (eGFR).  ?  Pregnancy test within 1 month of starting treatment for females of childbearing age  Any time prior to starting treatment:  ? Test for HCV genotype.  ? Assess for hepatic fibrosis.  ? Quantitative HCV RNA (HCV viral load).  ? Assess for active HBV coinfection: HBV surface antigen (HBsAg); If HBsAg positive, then should be assessed for whether HBV DNA level meets AASLD criteria for HBV treatment.  ? Assess for evidence of prior HBV infection and immunity: HBV core antibody (anti-HBc) and HBV surface antibody (anti-HBS).   ? Assess for HIV coinfection. ? Test for the presence of resistance-associated substitutions (RASs) prior to starting treatment if clinically indicated.    Monitoring during treatment:   ? Diabetics should monitor for signs of hypoglycemia.  ? Patients on warfarin should monitor for changes in INR.  ? Pregnancy test monthly in females of childbearing age  ? For HBsAg positive patients not already on HBV therapy because baseline HBV DNA level does not meet treatment criteria:   o Initiate prophylactic HBV antiviral therapy OR  o Monitor HBV DNA levels monthly during and immediately after Epclusa therapy.    After treatment to document a cure:   ? Quantitative HCV RNA12 weeks after completion of therapy.    Contraindications, Warnings, & Precautions     ? Hepatitis B reactivation.  ? Bradycardia with amiodarone coadministration: Coadministration not recommended. If unavoidable, patients should have inpatient cardiac monitoring for 48 hours after first Epclusa dose, followed by daily outpatient or self-monitoring of heart rate for at least the first 2 weeks of treatment. Due to the long half-life of amiodarone, cardiac monitoring is also recommended if amiodarone was discontinued just prior to beginning treatment.     Drug/Food Interactions     ? Medication list reviewed in Epic. The patient was instructed to inform the care team before taking any new medications or supplements. Drug interactions are as follows: .  o Atorvastatin: needs to self monitor for signs of toxicity from increased serum concentrations of atorvastatin such as muscle pain or darkened urine  o Tums OTC: needs to space dose at least 4 hours away from [place]  ? Encourage minimizing alcohol intake. ? Potential interactions: Clearance of HCV infection with direct acting antivirals may lead to changes in hepatic function, which may impact the safe and effective use of concomitant medications. Frequent monitoring of relevant laboratory parameters (e.g., International Normalized Ratio [INR] in patients taking warfarin, blood glucose levels in diabetic patients) or drug concentrations of concomitant medications such as cytochrome P450 substrates with a narrow therapeutic index (e.g. certain immunosuppressants) is recommended to ensure safe and effective use. Dose adjustments of concomitant medications may be necessary.    Storage, Handling Precautions, & Disposal     ? Store this medication in the original container at room temperature.  ? Store in a dry place. Do not store in a bathroom.  ? Keep all drugs in a safe place. Keep all drugs out of the reach of children and pets.  ? Disposal: You should NOT have any Epclusa left over to throw away      Current Medications (including OTC/herbals), Comorbidities and Allergies     Current Outpatient Medications   Medication Sig Dispense Refill   ??? acetaminophen (TYLENOL ARTHRITIS PAIN) 650 MG CR tablet Take 650 mg by mouth every eight (8) hours as needed for pain.     ??? aspirin (ECOTRIN) 81 MG tablet Take 81 mg by mouth daily.     ??? atorvastatin (LIPITOR)  20 MG tablet Take 1 tablet (20 mg total) by mouth every evening. 90 each 1   ??? citalopram (CELEXA) 40 MG tablet Take 1 tablet (40 mg total) by mouth daily. 90 tablet 1   ??? levothyroxine (SYNTHROID) 25 MCG tablet Take 1 tablet (25 mcg total) by mouth daily. 90 each 1   ??? lisinopriL (PRINIVIL,ZESTRIL) 5 MG tablet Take 1 tablet (5 mg total) by mouth daily. Hold for SBP < 90 and/or MAP < 70 and/or potassium in the past 24 hours > 5.5 and/or SCr in the past 24 hours > 2.5 90 each 1   ??? loratadine (CLARITIN) 10 mg tablet Take 10 mg by mouth daily. ??? multivitamin (TAB-A-VITE/THERAGRAN) per tablet Take 1 tablet by mouth daily.     ??? naproxen sodium (ALEVE ORAL) Take by mouth two (2) times a day. Dose unknown.     ??? sofosbuvir-velpatasvir (EPCLUSA) 400-100 mg tablet Take 1 tablet by mouth daily. 28 tablet 2     No current facility-administered medications for this visit.        Allergies   Allergen Reactions   ??? Mango Hives   ??? Statins-Hmg-Coa Reductase Inhibitors Muscle Pain     Lipitor & Lovastatin  Patient reports having a problem with higher doses of these statins and is not having a problem at the lower dose of atorvastatin 20mg    ??? Strawberry Extract Rash       Patient Active Problem List   Diagnosis   ??? Hypertension   ??? History of TIAs   ??? Seasonal allergic rhinitis due to pollen   ??? Chronic hepatitis C (CMS-HCC)   ??? Hyperlipidemia   ??? Environmental allergies   ??? Disease of thyroid gland   ??? Depression   ??? Elevated LFTs   ??? Macrocytosis       Reviewed and up to date in Epic.    Appropriateness of Therapy     Is medication and dose appropriate based on diagnosis? Yes    Baseline Quality of Life Assessment      How many days over the past month did your Hepatitis C keep you from your normal activities? 0    Financial Information     Medication Assistance provided: Prior Authorization    Anticipated copay of $100.00 reviewed with patient. Verified delivery address.    Delivery Information     Scheduled delivery date: 01/20/2019    Expected start date: 01/21/2019    Medication will be delivered via Same Day Courier to the home address in [place].  This shipment will not require a signature. Explained the services we provide at [facility] and that each month we would call to set up refills.  Stressed importance of returning phone calls so that we could ensure they receive their medications in time each month.  Informed patient that we should be setting up refills 7-10 days prior to when they will run out of medication.  A pharmacist will reach out to perform a clinical assessment periodically.  Informed patient that a welcome packet and a drug information handout will be sent.      Patient verbalized understanding of the above information as well as how to contact the pharmacy at [phone] option 4 with any questions/concerns.  The pharmacy is open Monday through Friday 8:30am-4:30pm.  A pharmacist is available 24/7 via pager to answer any clinical questions they may have.    Patient Specific Needs     ? Does the patient  have any physical, cognitive, or cultural barriers? No    ? Patient prefers to have medications discussed with  Patient     ? Is the patient able to read and understand education materials at a high school level or above? Yes    ? Patient's primary language is  English     ? Is the patient high risk? No     ? Does the patient require a Care Management Plan? No     ? Does the patient require physician intervention or other additional services (i.e. nutrition, smoking cessation, social work)? No      Roderic Palau  [facility] Specialty Pharmacist

## 2019-01-20 MED FILL — SOFOSBUVIR 400 MG-VELPATASVIR 100 MG TABLET: 28 days supply | Qty: 28 | Fill #0 | Status: AC

## 2019-01-20 MED FILL — SOFOSBUVIR 400 MG-VELPATASVIR 100 MG TABLET: ORAL | 28 days supply | Qty: 28 | Fill #0

## 2019-01-26 NOTE — Unmapped (Signed)
Assessment/Plan:    Kimberly Reynolds was seen today for follow-up.    Diagnoses and all orders for this visit:    Cough  -     XR Chest 2 views; Future  -     azithromycin (ZITHROMAX) 250 MG tablet; Take 2 tablets (500 mg) on  Day 1,  followed by 1 tablet (250 mg) once daily on Days 2 through 5.  -     fluticasone propion-salmeteroL (ADVAIR DISKUS) 100-50 mcg/dose diskus; Inhale 1 puff Two (2) times a day.    SOB (shortness of breath)  -     XR Chest 2 views; Future  -     azithromycin (ZITHROMAX) 250 MG tablet; Take 2 tablets (500 mg) on  Day 1,  followed by 1 tablet (250 mg) once daily on Days 2 through 5.  -     fluticasone propion-salmeteroL (ADVAIR DISKUS) 100-50 mcg/dose diskus; Inhale 1 puff Two (2) times a day.    Chronic hepatitis C without hepatic coma (CMS-HCC)    Disease of thyroid gland    Seasonal allergic rhinitis due to pollen    Essential hypertension    Hyperlipidemia, unspecified hyperlipidemia type    Macrocytosis    Environmental allergies    Depression, unspecified depression type    Other orders  -     INFLUENZA VACCINE (QUAD) IM - 6 MO-ADULT - PF      - Chronic Hep C: pt has just started Rxment, 2 weeks ago and scheduled for follow up in the next couple of weeks. Her Pre Rx LFT's remain elevated but I suspect these will improve or normalize with Rx.  Her macrocytosis has resolved on recent CBC.  She reports no side effects to the medication.  - HTN: good control on present med regimen. She is advised to resume home BP monitoring and maintain low salt intake. She had recent normal electrolytes and serum creatinine level in 09/2018.   - dyslipidemia: well controlled on current statin regimen. She had recent normal lipid panel in 09/2018. She has elevated LFT's due to Hep C infection.  - hypothyroidism: pt to continue levothyroxine supplementation as directed. Her TSH was normal in 09/2018.  - Depression: well controlled on current SSRI regimen.   - persistent dry cough with some mild SOB since Covid in 05/2018.  She had negative recent Covid 19 IgG testing. She is referred for CXR. Trial of Z pack as directed along with Advair disk for 1 month. She will let me know if her symptoms persist and she will need further evaluation at that time.      Return in about 6 months (around 07/30/2019) for Annual physical.    Subjective:     HPI  The patient is seen today for routine follow-up visit.  We had a telemedicine encounter in June/2020 for the following HPI:  Disease of thyroid gland  Chronic hepatitis C   Essential hypertension  Depression  Elevated LFTs  Hyperlipidemia  Macrocytosis    She was advised and as follows:  - hypothyroidism- pt to continue levothyroxine supplementation as directed.  - essential HTN: pt advised to continue present HTN medication regimen along with low salt intake and home BP monitoring.  - depression-: stable on her current medication regimen.  - chronic Hep C infection: pt has ongoing transportation issues. She is considering scheduling a GI/liver appt in the near future when she has a friend who can take her.  - elevated LFT's and macrocytosis most likely due to  untreated Hep C infection  -dyslipidemia: pt to continue to low fat diet and low dose statin as directed: caution with elevated LFT's.    - new complaint of ongoing dry cough and some SOB: her last CXR was negative in 2016. She believes she had Covid 19 although the testing was not done. She had cough, loss of taste and smell at that time.  She is a non smoker and she has no history of asthma. She reports no wheezing.  The pt did have more recent Covid 19 IgG testing which was negative.  She does have a history of environmental allergies and bronchitis.         Comprehensive lab work done in July/2020 included TSH, CMP, Lipids and CBC.  CMP result revealed persistent elevation in AST/ALT of 108/90, TSH of 3.4, lipid panel with LDL of 82 and HDL 57.  She was subsequently seen by [facility] GI/liver clinic in September/2020 and the following is noted from that visit:    Assessment/Plan:  1. Chronic hepatitis C, treatment naive, genotype 1A:   Kimberly Reynolds is a 78 y.o. with chronic hepatitis C, treatment naive with genotype 1a. Chief complaint for today;s visit is reevaluation and management of HCV. She had fallen out of care since 2017. 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 Currently drinking 1/2 bottle of wine nightly.  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 Today we readdressed the natural history of HCV infection, including the risks for progression to cirrhosis, liver failure, liver cancer, and risks of hepatocellular carcinoma. We discussed the importance of remaining abstinent from alcohol due to additive effects on disease progression to cirrhosis.  We discussed potential treatment options that include all-oral regimens with low rates of side effects and high rates of cure (sustained virological response). Cure rates greater than 95 percent.    ~ Future laboratory studies ordered under the care of [place]. MELD labs, HCV RNA level, hepatitis B surface antibody, and fibrosure HCV test.   ~ Will address PA process with Park Breed, clinical pharmacist with our department.   ~ Office follow up three months tentative, sooner if txment approved.   2. Alcohol assessment: Stress grave importance of obtaining and maintaining sobriety. Increase risk for more advanced fibrosis and/or cirrhosis. Patient voiced understanding. Advises she is able to quit. Positive outcome in past.   3. Liver Screening: Abdominal ultrasound ordered.   4.  COVID-19 exposure with history of being symptomatic January of this year: COVID total antibody test ordered per patient request.  5. 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    6. Depression, hx of: Per patient controlled at this time.   7. Generalized joint pain: Recommend trial of Voltaren gel prn. Increase risk for renal insufficiency with daily NSAID use as well as PUD/gastritis. Patient agreeable to proceed with trial. Instructed OTC. Follow up with PCP, possible trial of lidocaine patches.   ??  The pt has started with recommended Hep C Rxment with no side effects reported.       ROS  Constitutional:  Denies  unexpected weight loss or gain, or weakness   Eyes:  Denies visual changes  Respiratory:  History as noted above  Cardiovascular:  Denies chest pain, palpitations or lower extremity swelling   GI:  Denies abdominal pain, diarrhea, constipation   Musculoskeletal:  Denies myalgias  Skin:  Denies nonhealing lesions  Neurologic:  Denies headache, focal weakness or numbness, tingling  Endocrine:  Denies polyuria or polydypsia   Psychiatric:  Denies depression, anxiety      Outpatient Medications Prior to Visit   Medication Sig Dispense Refill   ??? acetaminophen (TYLENOL ARTHRITIS PAIN) 650 MG CR tablet Take 650 mg by mouth every eight (8) hours as needed for pain.     ??? aspirin (ECOTRIN) 81 MG tablet Take 81 mg by mouth daily.     ??? atorvastatin (LIPITOR) 20 MG tablet Take 1 tablet (20 mg total) by mouth every evening. 90 each 1   ??? citalopram (CELEXA) 40 MG tablet Take 1 tablet (40 mg total) by mouth daily. 90 tablet 1   ??? levothyroxine (SYNTHROID) 25 MCG tablet Take 1 tablet (25 mcg total) by mouth daily. 90 each 1   ??? lisinopriL (PRINIVIL,ZESTRIL) 5 MG tablet Take 1 tablet (5 mg total) by mouth daily. Hold for SBP < 90 and/or MAP < 70 and/or potassium in the past 24 hours > 5.5 and/or SCr in the past 24 hours > 2.5 90 each 1   ??? loratadine (CLARITIN) 10 mg tablet Take 10 mg by mouth daily.     ??? multivitamin (TAB-A-VITE/THERAGRAN) per tablet Take 1 tablet by mouth daily.     ??? naproxen sodium (ALEVE ORAL) Take by mouth two (2) times a day. Dose unknown.     ??? sofosbuvir-velpatasvir (EPCLUSA) 400-100 mg tablet Take 1 tablet by mouth daily. 28 tablet 2     No facility-administered medications prior to visit.          Objective:   40 min encounter, greater then 50% counseling and coordination of care    Vital Signs  BP 120/80  - Pulse 84  - Temp 36.6 ??C (97.9 ??F)  - Ht 167.6 cm (5' 6)  - Wt 81.6 kg (180 lb)  - SpO2 98%  - BMI 29.05 kg/m??      Exam  General: normal appearance  EYES: Anicteric sclerae.  ENT: Oropharynx moist.  RESP: Relaxed respiratory effort. Clear to auscultation without wheezes or crackles.   CV: Regular rate and rhythm. Normal S1 and S2. No murmurs or gallops.  No lower extremity edema. Posterior tibial pulses are 2+ and symmetric.  abd exam: non tender, no masses, no HSM   MSK: No focal muscle tenderness.  SKIN: Appropriately warm and moist.  NEURO: Stable gait and coordination.    Allergies:     Mango, Statins-hmg-coa reductase inhibitors, and Strawberry extract    Current Medications:     Current Outpatient Medications   Medication Sig Dispense Refill   ??? acetaminophen (TYLENOL ARTHRITIS PAIN) 650 MG CR tablet Take 650 mg by mouth every eight (8) hours as needed for pain.     ??? aspirin (ECOTRIN) 81 MG tablet Take 81 mg by mouth daily.     ??? atorvastatin (LIPITOR) 20 MG tablet Take 1 tablet (20 mg total) by mouth every evening. 90 each 1   ??? citalopram (CELEXA) 40 MG tablet Take 1 tablet (40 mg total) by mouth daily. 90 tablet 1   ??? levothyroxine (SYNTHROID) 25 MCG tablet Take 1 tablet (25 mcg total) by mouth daily. 90 each 1   ??? lisinopriL (PRINIVIL,ZESTRIL) 5 MG tablet Take 1 tablet (5 mg total) by mouth daily. Hold for SBP < 90 and/or MAP < 70 and/or potassium in the past 24 hours > 5.5 and/or SCr in the past 24 hours > 2.5 90 each 1   ???  loratadine (CLARITIN) 10 mg tablet Take 10 mg by mouth daily.     ??? multivitamin (TAB-A-VITE/THERAGRAN) per tablet Take 1 tablet by mouth daily.     ??? naproxen sodium (ALEVE ORAL) Take by mouth two (2) times a day. Dose unknown.     ??? sofosbuvir-velpatasvir (EPCLUSA) 400-100 mg tablet Take 1 tablet by mouth daily. 28 tablet 2   ??? azithromycin (ZITHROMAX) 250 MG tablet Take 2 tablets (500 mg) on  Day 1,  followed by 1 tablet (250 mg) once daily on Days 2 through 5. 6 tablet 0   ??? fluticasone propion-salmeteroL (ADVAIR DISKUS) 100-50 mcg/dose diskus Inhale 1 puff Two (2) times a day. 1 each 0     No current facility-administered medications for this visit.            Note - This record has been created using [employer]. Chart creation errors have been sought, but may not always have been located. Such creation errors do not reflect on the standard of medical care.    Jenell Milliner, MD

## 2019-01-29 ENCOUNTER — Ambulatory Visit: Admit: 2019-01-29 | Discharge: 2019-01-30 | Payer: MEDICARE

## 2019-01-29 DIAGNOSIS — B182 Chronic viral hepatitis C: Principal | ICD-10-CM

## 2019-01-29 DIAGNOSIS — E079 Disorder of thyroid, unspecified: Principal | ICD-10-CM

## 2019-01-29 DIAGNOSIS — R05 Cough: Principal | ICD-10-CM

## 2019-01-29 DIAGNOSIS — I10 Essential (primary) hypertension: Principal | ICD-10-CM

## 2019-01-29 DIAGNOSIS — Z9109 Other allergy status, other than to drugs and biological substances: Principal | ICD-10-CM

## 2019-01-29 DIAGNOSIS — R0602 Shortness of breath: Principal | ICD-10-CM

## 2019-01-29 DIAGNOSIS — F329 Major depressive disorder, single episode, unspecified: Principal | ICD-10-CM

## 2019-01-29 DIAGNOSIS — E785 Hyperlipidemia, unspecified: Principal | ICD-10-CM

## 2019-01-29 DIAGNOSIS — D7589 Other specified diseases of blood and blood-forming organs: Principal | ICD-10-CM

## 2019-01-29 DIAGNOSIS — J301 Allergic rhinitis due to pollen: Principal | ICD-10-CM

## 2019-01-29 MED ORDER — FLUTICASONE 100 MCG-SALMETEROL 50 MCG/DOSE BLISTR POWDR FOR INHALATION
Freq: Two times a day (BID) | RESPIRATORY_TRACT | 0 refills | 0 days | Status: CP
Start: 2019-01-29 — End: 2020-01-29

## 2019-01-29 MED ORDER — AZITHROMYCIN 250 MG TABLET
ORAL_TABLET | 0 refills | 5 days | Status: CP
Start: 2019-01-29 — End: 2019-02-03

## 2019-02-10 NOTE — Unmapped (Signed)
[facility] Clinical Assessment & Refill Coordination Note    Kimberly Reynolds, DOB: 1940-11-07  Phone: [phone] (home)     All above HIPAA information was verified with patient.     Specialty Medication(s):   Infectious Disease: sofosbuvir/velpatasvir     Current Outpatient Medications   Medication Sig Dispense Refill   ??? azithromycin (ZITHROMAX) 250 MG tablet Take 250 mg by mouth daily. Takes 2 tablets (500mg ) on day 1, then 1 tablet daily on days 2 through 5     ??? acetaminophen (TYLENOL ARTHRITIS PAIN) 650 MG CR tablet Take 650 mg by mouth every eight (8) hours as needed for pain.     ??? aspirin (ECOTRIN) 81 MG tablet Take 81 mg by mouth daily.     ??? atorvastatin (LIPITOR) 20 MG tablet Take 1 tablet (20 mg total) by mouth every evening. 90 each 1   ??? citalopram (CELEXA) 40 MG tablet Take 1 tablet (40 mg total) by mouth daily. 90 tablet 1   ??? fluticasone propion-salmeteroL (ADVAIR DISKUS) 100-50 mcg/dose diskus Inhale 1 puff Two (2) times a day. 1 each 0   ??? levothyroxine (SYNTHROID) 25 MCG tablet Take 1 tablet (25 mcg total) by mouth daily. 90 each 1   ??? lisinopriL (PRINIVIL,ZESTRIL) 5 MG tablet Take 1 tablet (5 mg total) by mouth daily. Hold for SBP < 90 and/or MAP < 70 and/or potassium in the past 24 hours > 5.5 and/or SCr in the past 24 hours > 2.5 90 each 1   ??? loratadine (CLARITIN) 10 mg tablet Take 10 mg by mouth daily.     ??? multivitamin (TAB-A-VITE/THERAGRAN) per tablet Take 1 tablet by mouth daily.     ??? naproxen sodium (ALEVE ORAL) Take by mouth two (2) times a day. Dose unknown.     ??? sofosbuvir-velpatasvir (EPCLUSA) 400-100 mg tablet Take 1 tablet by mouth daily. 28 tablet 2     No current facility-administered medications for this visit.         Changes to medications: Jatia reports starting the following medications: azithromycin (500mg  on day 1, 250mg  on days 2 through 5) and Dulera    Allergies   Allergen Reactions   ??? Mango Hives ??? Statins-Hmg-Coa Reductase Inhibitors Muscle Pain     Lipitor & Lovastatin  Patient reports having a problem with higher doses of these statins and is not having a problem at the lower dose of atorvastatin 20mg    ??? Strawberry Extract Rash       Changes to allergies: No    SPECIALTY MEDICATION ADHERENCE     sofosbuvir-velpatasvir 400-100 mg: 8 days of medicine on hand       Medication Adherence    Patient reported X missed doses in the last month: 0  Specialty Medication: sofosbuvir-velpatasvir 400-100mg   Patient is on additional specialty medications: No  Informant: patient  Provider-estimated medication adherence level: good  Patient is at risk for Non-Adherence: No          Specialty medication(s) dose(s) confirmed: Regimen is correct and unchanged.     Are there any concerns with adherence? No    Adherence counseling provided? Not needed    CLINICAL MANAGEMENT AND INTERVENTION      Clinical Benefit Assessment:    Do you feel the medicine is effective or helping your condition? Yes    Clinical Benefit counseling provided? Not needed    Adverse Effects Assessment:    Are you experiencing any side effects? No  Are you experiencing difficulty administering your medicine? No    Quality of Life Assessment:    How many days over the past month did your Hepatitis C  keep you from your normal activities? For example, brushing your teeth or getting up in the morning. 0    Have you discussed this with your provider? Not needed    Therapy Appropriateness:    Is therapy appropriate? Yes, therapy is appropriate and should be continued    DISEASE/MEDICATION-SPECIFIC INFORMATION      For Hepatitis C patients:  Regimen: sofosbuvir-velpatasvir x 12 weeks  Therapy start date: 01/21/2019  Completed Treatment Week #: 2  What time of day do you take your medicine? between 1 and 2 pm  Pill count over the phone revealed #8 tablets which is appropriate.    Are you taking any new OTC or herbal medication? No Any alcoholic beverages? No  Do you have a follow up appointment? No, appointment is not scheduled, I notified clinic and instructed to call [facility] [phone] for patients of Dr. Fidela Juneau and Tina Griffiths OR [phone] option1, option 1 for all other providers   * I reminded her to make her appointment and she said she will call tomorrow to schedule her treatment week 4 appointment**  PATIENT SPECIFIC NEEDS     ? Does the patient have any physical, cognitive, or cultural barriers? No    ? Is the patient high risk? No     ? Does the patient require a Care Management Plan? No     ? Does the patient require physician intervention or other additional services (i.e. nutrition, smoking cessation, social work)? No      SHIPPING     Specialty Medication(s) to be Shipped:   Infectious Disease: sofosbuvir/velpatasvir    Other medication(s) to be shipped: n/a     Changes to insurance: No    Delivery Scheduled: Yes, Expected medication delivery date: 02/12/2019.     Medication will be delivered via Next Day Courier to the confirmed prescription address in [facility].    The patient will receive a drug information handout for each medication shipped and additional FDA Medication Guides as required.  Verified that patient has previously received a Conservation officer, historic buildings.    All of the patient's questions and concerns have been addressed.    Roderic Palau   [facility] Specialty Pharmacist

## 2019-02-11 MED FILL — SOFOSBUVIR 400 MG-VELPATASVIR 100 MG TABLET: 28 days supply | Qty: 28 | Fill #1 | Status: AC

## 2019-02-11 MED FILL — SOFOSBUVIR 400 MG-VELPATASVIR 100 MG TABLET: ORAL | 28 days supply | Qty: 28 | Fill #1

## 2019-02-11 NOTE — Unmapped (Signed)
Reason for call: Coordinate TW # 4 apt  ??  Hep C  Genotype: 1a (08/02/15)  Treatment: Epclusa x 12 wks  Start date: 01/21/19  Fibrosis F1-F2/7.6 Kpa Fibroscan on 10/24/15??  TW # 3    Called pt at phone number [phone] to coordinate TW # 4 apt with Owens Shark, DNP. New apt coordinated for 02/16/19 @ 1:00 PM with Owens Shark via tele health. Pt is able to go to Dr. Kenton Kingfisher office in [place] and have labs completed.       Vertell Limber RN, BSN  Nursing Care Coordinator   Pharmacy Adult GI Medicine  [facility]  [address]   [place], [place] [address]  [phone]

## 2019-02-16 ENCOUNTER — Telehealth: Admit: 2019-02-16 | Discharge: 2019-02-17 | Payer: MEDICARE | Attending: Family | Primary: Family

## 2019-02-16 NOTE — Unmapped (Signed)
[facility]    Alba Destine, M.D.  Professor of Medicine  Director, [facility]  [place] [facility] [place] [facility] [place]    [phone]    Referring MD: Jenell Milliner, MD  [address]  [facility]  [place],  [place] [address]    Reason For Office Visit: Treatment follow up HCV, treatment naive  Epclusa x 12 wks  Start date: 01/21/19  Fibrosis F1-F2/7.6 Kpa Fibroscan on 10/24/15??  + Fibrosure results consistent with F4.  TW # 4    Fatty infiltration of liver (metabolic syndrome and alcohol use)    History of Present Illness:  Kimberly Reynolds is a 78 y.o. Caucasian female whose chief complaint is treatment follow up ~ TW #4. Today's visit was conducted as telephonic visit as part of COVID-19 action plan. She has adhered to taking one tablet of Epclusa daily between 1 PM to 2 PM. Denies having missed any doses. She has drank a couple of glasses of wine so far during treatment. Overall she has tolerated treatment so far. Denies having experienced any potential adverse events except fatigue which was occurring prior to initiation of treatment. She has required anti acid use a couple of times. Taking Tums prn at least four hours after having taken Epclusa dose.     She had fallen out of care since 2017. She was treatment naive with genotype 1a. Previously April 2010, she had been seen in consultation by Dr. Karene Fry. At that time, she was deemed not to be an appropriate candidate due to alcohol abuse. She was instructed to participate in our an integrated alcohol program with our department. She never proceed forward in this manner and got lost to follow-up. Denies history of icteric hepatitis, an overt decompensated liver event or hospitalization related to her liver state. 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 Most recent employment worked in the [employer].     She is widow with handicapped daughter who resides group home. She does experience great deal of stress dealing with her daughter who is mentally retarded with psychologically disturbed, schizoaffective disorder, anger and PTSD. Her daughter has caused financial hardship. Part of the reason she had not followed up in the past. Daughter has attempted to take her life (physical aggressive outbreak to patient) as well as her daughter's attempting suicide several times. Patient had to sell her home and living in an apartment. Over the past couple of years she feels she has been able to get her life better. Denies any depressive concerns at this time. Denies being under the care of psych. She was consuming alcohol on daily basis, drinking 1/2 bottle of wine nightly. Denies nausea, vomiting, rectal bleeding or melena. Her biggest complaint is generalized joint pain involving her knees, lower back and right shoulder. She continues to take Aleve one tablet bid.     Review of Systems:  All systems reviewed and negative other than what is noted above.     Liver Care:   1. Chronic hepatitis C, treatment naive, genotype 1a  Baseline HCV RNA [id number]  IU/ml on 07/13/2015  HCV RNA 01/12/2019 - 934,403 international units/mL  HCV RNA TW #4 - pending result     2. Immunity hepatitis A: Non reactive   3. Immunity hepatitis B: Non Reactive. Warrant vaccination series.  4. HIV status: Non reactive   5. FibroScan: 7.6 kPa/F1-F2  6.  Liver screening: US Liver doppler 2017: Patent hepatic vasculature with appropriate flow direction.Echogenic liver compatible with fibrofatty change. Mild pulsatility of the hepatic vein waveforms which can be seen with fibrosis.    Abdominal ultrasound 12/28/2018: Echogenic liver with undulating contour, suggestive of hepatic steatosis and/or chronic. Spleen normal. No ascites.     7.  Iron serum: elevated 335 with elevated ferritin level 565.0    PMH/PSH:  Past Medical History: Diagnosis Date   ??? Arthritis    ??? Disease of thyroid gland 10/2016    subclinical hypothyroidism   ??? Environmental allergies    ??? Hepatitis C    ??? Hyperlipidemia    ??? Hypertension    ??? Infectious viral hepatitis    ??? TIA (transient ischemic attack)     history of two TIAs     Past Surgical History:   Procedure Laterality Date   ??? ABDOMINAL SURGERY     ??? EYE SURGERY  2017    right eye   ??? HYSTERECTOMY     ??? TONSILLECTOMY AND ADENOIDECTOMY     ??? TOTAL ABDOMINAL HYSTERECTOMY     ??? WISDOM TOOTH EXTRACTION       Allergies   Allergen Reactions   ??? Mango Hives   ??? Statins-Hmg-Coa Reductase Inhibitors Muscle Pain     Lipitor & Lovastatin  Patient reports having a problem with higher doses of these statins and is not having a problem at the lower dose of atorvastatin 20mg    ??? Strawberry Extract Rash     Current Medications:  Current Outpatient Medications   Medication Sig Dispense Refill   ??? acetaminophen (TYLENOL ARTHRITIS PAIN) 650 MG CR tablet Take 650 mg by mouth every eight (8) hours as needed for pain.     ??? aspirin (ECOTRIN) 81 MG tablet Take 81 mg by mouth daily.     ??? atorvastatin (LIPITOR) 20 MG tablet Take 1 tablet (20 mg total) by mouth every evening. 90 each 1   ??? azithromycin (ZITHROMAX) 250 MG tablet Take 2 tablets (500 mg) on  Day 1,  followed by 1 tablet (250 mg) once daily on Days 2 through 5. (Patient taking differently: 250 mg. Take 2 tablets (500 mg) on  Day 1,  followed by 1 tablet (250 mg) once daily on Days 2 through 5.) 6 tablet 0   ??? citalopram (CELEXA) 40 MG tablet Take 1 tablet (40 mg total) by mouth daily. 90 tablet 1   ??? fluticasone propion-salmeteroL (ADVAIR DISKUS) 100-50 mcg/dose diskus Inhale 1 puff Two (2) times a day. 1 each 0   ??? levothyroxine (SYNTHROID) 25 MCG tablet Take 1 tablet (25 mcg total) by mouth daily. 90 each 1   ??? lisinopriL (PRINIVIL,ZESTRIL) 5 MG tablet Take 1 tablet (5 mg total) by mouth daily. Hold for SBP < 90 and/or MAP < 70 and/or potassium in the past 24 hours > 5.5 and/or SCr in the past 24 hours > 2.5 90 each 1   ??? loratadine (CLARITIN) 10 mg tablet Take 10 mg by mouth daily.     ??? multivitamin (TAB-A-VITE/THERAGRAN) per tablet Take 1 tablet by mouth daily.     ??? naproxen sodium (ALEVE ORAL) Take by mouth two (2) times a day. Dose unknown.     ??? sofosbuvir-velpatasvir (EPCLUSA) 400-100 mg tablet Take 1 tablet by mouth daily. 28 tablet 2     No current facility-administered medications for this  visit.      Social History  Social History     Tobacco Use   ??? Smoking status: Never Smoker   ??? Smokeless tobacco: Never Used   Substance Use Topics   ??? Alcohol use: Yes     Alcohol/week: 29.0 standard drinks     Types: 28 Glasses of wine, 1 Cans of beer per week   ??? Drug use: No   Widow with one adopted daughter.  Retired. Resides alone. Apartment.     Family History:  Family History   Problem Relation Age of Onset   ??? Diabetes insipidus Mother    ??? Kidney failure Mother    ??? Aneurysm Father         dissecting aortic aneursym   ??? No Known Problems Sister    ??? No Known Problems Brother    ??? No Known Problems Sister    ??? Leukemia Maternal Grandmother      Physical Examination:  VS and PE were not obtained given nature of today's visit.     Lab:  Future laboratory studies ordered to be drawn under the care of her PCP's office. Affiliated with [facility].     Assessment/Plan:  1. Chronic hepatitis C, treatment naive, genotype 1A:   Kimberly Reynolds is a 78 y.o. with chronic hepatitis C, treatment naive with genotype 1a. Chief complaint for today's visit is treatment follow up ~ TW #4. She has adhered to treatment as prescribed. She has continued to consume alcohol more social basis during treatment. Occasional use of OTC anti acid medication. Overall tolerating treatment well. PMH of  evidence of fatty infiltration of liver felt to be multifactorial ~ + metabolic syndrome involvement and moderate to heavy alcohol use with periods of sobriety in past. Currently drinking 1/2 bottle of wine nightly. 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 Fibrosure consistent with F4.      ~ Future HCV safety labs ordered.   ~ Continue Epclusa x 12 weeks   ~ No alcohol use   ~ Increase water intake   ~ Office follow up nine weeks for EOT.     2. Alcohol assessment: Again stressed grave importance of obtaining and maintaining sobriety.    3. Liver Screening: Warrants surveillance every six months. Due March 2020.     4. COVID-19 exposure: Retreatment with Z-pak     5. Depression, hx of: Per patient controlled at this time.     6. Generalized joint pain:continues with Aleve bid. Encouraged patient to avoid anti inflammatory medications and encouraged Tylenol use. Okay up to 2,000 mg total daily prn pain. Increase risk for AKI, ulcer disease, and avoidance NSAIDs in setting of advanced fibrosis.     7.  Evidence of atherosclerosis involving aorta on recent imaging: Will reach out to her PCP and request patient to follow up with Dr. Vinson Moselle to address this finding at future visit.     All patient's questions were answered to her satisfaction during visit today.     I spent 26 minutes on the real time audio and video visit with the patient. I spent an additional 18 minutes on pre- and post-visit activities.     The patient was not located and I was not located within 250 yards of a hospital based location during the audio and video visit. The patient was physically located in [place] or a state in which I am permitted to provide care. The patient and/or parent/guardian understood that s/he may incur co-pays and cost  sharing, and agreed to the telemedicine visit. The visit was reasonable and appropriate under the circumstances given the patient's presentation at the time.    The patient and/or parent/guardian has been advised of the potential risks and limitations of this mode of treatment (including, but not limited to, the absence of in-person examination) and has agreed to be treated using telemedicine. The patient's/patient's family's questions regarding telemedicine have been answered.    If the visit was completed in an ambulatory setting, the patient and/or parent/guardian has also been advised to contact their provider???s office for worsening conditions, and seek emergency medical treatment and/or call 911 if the patient deems either necessary.    Rodman Key, DNP, FNP-BC  [facility]  [address]  [place] [address]  Phone [phone]

## 2019-03-08 NOTE — Unmapped (Signed)
[facility] Refill Coordination Note    Specialty Medication(s) to be Shipped:   Infectious Disease: sofosbuvir/velpatasvir    Other medication(s) to be shipped: n/a     Kimberly Reynolds, DOB: November 05, 1940  Phone: [phone] (home)       All above HIPAA information was verified with patient.     Was a Nurse, learning disability used for this call? No    Completed refill call assessment today to schedule patient's medication shipment from the [facility] [phone]).       Specialty medication(s) and dose(s) confirmed: Regimen is correct and unchanged.   Changes to medications: Delitha reports no changes at this time.  Changes to insurance: No  Questions for the pharmacist: Yes: Patient is having side effects of Loss of hair, and faigue. Patient would like pharmacist to call her back regarding the loss of hair    Confirmed patient received Welcome Packet with first shipment. The patient will receive a drug information handout for each medication shipped and additional FDA Medication Guides as required.       DISEASE/MEDICATION-SPECIFIC INFORMATION        For Hepatitis C patients:  Regimen: SOFOSBUVIR-VELPATASVIR x 12 weeks  Therapy start date: 10/22  What time of day do you take your medicine? between 1 and 2 pm  Pill count over the phone revealed #19 tablets which is appropriate.    Are you taking any new OTC or herbal medication? No  Do you have a follow up appointment? No, appointment is not scheduled, I notified clinic and instructed to call [facility] [phone] for patients of Dr. Fidela Juneau and Tina Griffiths OR [phone] option1, option 1 for all other providers    Barriers to appointment: transportation if not at local physician 2 blocks from where she lives. Transportation is a barrier to [employer].    SPECIALTY MEDICATION ADHERENCE     Medication Adherence    Patient reported X missed doses in the last month: 0  Specialty Medication: SOFOSBUVIR-VELPATASVIR 400-100mg  SHIPPING     Shipping address confirmed in Epic.     Delivery Scheduled: Yes, Expected medication delivery date: 12/15.     Medication will be delivered via Next Day Courier to the prescription address in Epic WAM.    Westley Gambles   [facility] Specialty Technician

## 2019-03-09 NOTE — Unmapped (Signed)
This patient has been disenrolled from the [facility] specialty pharmacy services due to therapy completion - expected therapy completion date: 04/14/2019. Last shipment will be delivered on 03/16/2019.    Kimberly Reynolds  [facility] Specialty Pharmacist

## 2019-03-15 MED FILL — SOFOSBUVIR 400 MG-VELPATASVIR 100 MG TABLET: ORAL | 28 days supply | Qty: 28 | Fill #2

## 2019-03-15 MED FILL — SOFOSBUVIR 400 MG-VELPATASVIR 100 MG TABLET: 28 days supply | Qty: 28 | Fill #2 | Status: AC

## 2019-04-14 MED ORDER — LEVOTHYROXINE 25 MCG TABLET
Freq: Every day | ORAL | 1 refills | 90 days | Status: CP
Start: 2019-04-14 — End: 2020-04-13

## 2019-04-29 ENCOUNTER — Other Ambulatory Visit: Admit: 2019-04-29 | Discharge: 2019-04-30 | Payer: MEDICARE

## 2019-04-29 DIAGNOSIS — B182 Chronic viral hepatitis C: Principal | ICD-10-CM

## 2019-04-29 DIAGNOSIS — R748 Abnormal levels of other serum enzymes: Principal | ICD-10-CM

## 2019-04-29 DIAGNOSIS — K7460 Unspecified cirrhosis of liver: Secondary | ICD-10-CM

## 2019-04-29 DIAGNOSIS — Z7289 Other problems related to lifestyle: Principal | ICD-10-CM

## 2019-05-04 ENCOUNTER — Telehealth: Admit: 2019-05-04 | Discharge: 2019-05-05 | Attending: Family | Primary: Family

## 2019-06-14 DIAGNOSIS — E785 Hyperlipidemia, unspecified: Principal | ICD-10-CM

## 2019-06-14 MED ORDER — ATORVASTATIN 20 MG TABLET
ORAL_TABLET | Freq: Every evening | ORAL | 1 refills | 90 days | Status: CP
Start: 2019-06-14 — End: 2020-06-13

## 2019-07-27 DIAGNOSIS — F329 Major depressive disorder, single episode, unspecified: Principal | ICD-10-CM

## 2019-07-27 DIAGNOSIS — I10 Essential (primary) hypertension: Principal | ICD-10-CM

## 2019-07-27 MED ORDER — LISINOPRIL 5 MG TABLET
ORAL_TABLET | 0 refills | 0 days
Start: 2019-07-27 — End: ?

## 2019-07-27 MED ORDER — CITALOPRAM 40 MG TABLET
ORAL_TABLET | 1 refills | 0 days
Start: 2019-07-27 — End: ?

## 2019-07-28 MED ORDER — CITALOPRAM 40 MG TABLET
ORAL_TABLET | 1 refills | 0 days | Status: CP
Start: 2019-07-28 — End: ?

## 2019-07-30 ENCOUNTER — Ambulatory Visit: Admit: 2019-07-30 | Discharge: 2019-07-31 | Payer: MEDICARE

## 2019-07-30 ENCOUNTER — Other Ambulatory Visit: Payer: Self-pay | Admitting: Specialist

## 2019-07-30 DIAGNOSIS — K7460 Unspecified cirrhosis of liver: Principal | ICD-10-CM

## 2019-07-30 DIAGNOSIS — Z0100 Encounter for examination of eyes and vision without abnormal findings: Principal | ICD-10-CM

## 2019-07-30 DIAGNOSIS — I10 Essential (primary) hypertension: Principal | ICD-10-CM

## 2019-07-30 DIAGNOSIS — Z7289 Other problems related to lifestyle: Principal | ICD-10-CM

## 2019-07-30 DIAGNOSIS — M25511 Pain in right shoulder: Principal | ICD-10-CM

## 2019-07-30 DIAGNOSIS — Z5181 Encounter for therapeutic drug level monitoring: Principal | ICD-10-CM

## 2019-07-30 DIAGNOSIS — Z0000 Encounter for general adult medical examination without abnormal findings: Principal | ICD-10-CM

## 2019-07-30 DIAGNOSIS — B182 Chronic viral hepatitis C: Principal | ICD-10-CM

## 2019-07-30 DIAGNOSIS — R5382 Chronic fatigue, unspecified: Principal | ICD-10-CM

## 2019-07-30 DIAGNOSIS — M545 Low back pain: Principal | ICD-10-CM

## 2019-07-30 DIAGNOSIS — Z1321 Encounter for screening for nutritional disorder: Principal | ICD-10-CM

## 2019-07-30 DIAGNOSIS — E785 Hyperlipidemia, unspecified: Principal | ICD-10-CM

## 2019-07-30 DIAGNOSIS — R748 Abnormal levels of other serum enzymes: Principal | ICD-10-CM

## 2019-07-30 DIAGNOSIS — Z1231 Encounter for screening mammogram for malignant neoplasm of breast: Principal | ICD-10-CM

## 2019-10-26 DIAGNOSIS — I10 Essential (primary) hypertension: Principal | ICD-10-CM

## 2019-10-26 MED ORDER — LISINOPRIL 5 MG TABLET
ORAL_TABLET | Freq: Every day | ORAL | 0 refills | 90 days | Status: CP
Start: 2019-10-26 — End: 2020-10-25

## 2019-10-26 MED ORDER — LEVOTHYROXINE 25 MCG TABLET
ORAL_TABLET | Freq: Every day | ORAL | 0 refills | 90 days | Status: CP
Start: 2019-10-26 — End: 2020-10-25

## 2020-01-17 DIAGNOSIS — E785 Hyperlipidemia, unspecified: Principal | ICD-10-CM

## 2020-01-17 DIAGNOSIS — F32A Depression, unspecified depression type: Principal | ICD-10-CM

## 2020-01-17 MED ORDER — ATORVASTATIN 20 MG TABLET
ORAL_TABLET | Freq: Every day | ORAL | 2 refills | 90.00000 days | Status: CP
Start: 2020-01-17 — End: ?

## 2020-01-17 MED ORDER — LEVOTHYROXINE 25 MCG TABLET
ORAL_TABLET | Freq: Every day | ORAL | 2 refills | 90 days | Status: CP
Start: 2020-01-17 — End: ?

## 2020-01-17 MED ORDER — CITALOPRAM 40 MG TABLET
ORAL_TABLET | Freq: Every day | ORAL | 2 refills | 90 days | Status: CP
Start: 2020-01-17 — End: ?

## 2020-01-24 DIAGNOSIS — I10 Essential (primary) hypertension: Principal | ICD-10-CM

## 2020-01-24 MED ORDER — LISINOPRIL 5 MG TABLET
ORAL_TABLET | Freq: Every day | ORAL | 0 refills | 90 days | Status: CP
Start: 2020-01-24 — End: ?

## 2020-01-27 ENCOUNTER — Ambulatory Visit: Admit: 2020-01-27 | Discharge: 2020-01-28 | Payer: MEDICARE

## 2020-01-27 DIAGNOSIS — R7989 Other specified abnormal findings of blood chemistry: Principal | ICD-10-CM

## 2020-01-27 DIAGNOSIS — F1010 Alcohol abuse, uncomplicated: Principal | ICD-10-CM

## 2020-01-27 DIAGNOSIS — I10 Essential (primary) hypertension: Principal | ICD-10-CM

## 2020-01-27 DIAGNOSIS — Z1231 Encounter for screening mammogram for malignant neoplasm of breast: Principal | ICD-10-CM

## 2020-01-27 DIAGNOSIS — R0602 Shortness of breath: Principal | ICD-10-CM

## 2020-01-27 DIAGNOSIS — D7589 Other specified diseases of blood and blood-forming organs: Principal | ICD-10-CM

## 2020-01-27 DIAGNOSIS — U071 COVID-19: Principal | ICD-10-CM

## 2020-01-27 DIAGNOSIS — E785 Hyperlipidemia, unspecified: Principal | ICD-10-CM

## 2020-01-27 DIAGNOSIS — E669 Obesity, unspecified: Principal | ICD-10-CM

## 2020-01-27 DIAGNOSIS — E079 Disorder of thyroid, unspecified: Principal | ICD-10-CM

## 2020-01-27 DIAGNOSIS — R053 Chronic cough: Principal | ICD-10-CM

## 2020-01-27 DIAGNOSIS — F32A Depression, unspecified depression type: Principal | ICD-10-CM

## 2020-01-27 DIAGNOSIS — B182 Chronic viral hepatitis C: Principal | ICD-10-CM

## 2020-03-09 ENCOUNTER — Ambulatory Visit: Admit: 2020-03-09 | Discharge: 2020-03-10 | Payer: MEDICARE

## 2020-04-24 DIAGNOSIS — I10 Essential (primary) hypertension: Principal | ICD-10-CM

## 2020-04-24 MED ORDER — LISINOPRIL 5 MG TABLET
ORAL_TABLET | 0 refills | 0 days
Start: 2020-04-24 — End: ?

## 2020-04-25 MED ORDER — LISINOPRIL 5 MG TABLET
ORAL_TABLET | 0 refills | 0 days | Status: CP
Start: 2020-04-25 — End: ?

## 2020-07-12 ENCOUNTER — Ambulatory Visit: Admit: 2020-07-12 | Discharge: 2020-07-13 | Payer: MEDICARE

## 2020-07-12 DIAGNOSIS — L304 Erythema intertrigo: Principal | ICD-10-CM

## 2020-07-12 MED ORDER — FLUCONAZOLE 150 MG TABLET
ORAL_TABLET | Freq: Once | ORAL | 0 refills | 4.00000 days | Status: CP
Start: 2020-07-12 — End: 2020-07-12

## 2020-07-12 MED ORDER — NYSTATIN 100,000 UNIT/GRAM TOPICAL POWDER
2 refills | 0 days | Status: CP
Start: 2020-07-12 — End: 2021-07-12

## 2020-07-17 DIAGNOSIS — L304 Erythema intertrigo: Principal | ICD-10-CM

## 2020-07-17 MED ORDER — CLINDAMYCIN HCL 300 MG CAPSULE
ORAL_CAPSULE | Freq: Three times a day (TID) | ORAL | 0 refills | 7 days | Status: CP
Start: 2020-07-17 — End: 2020-07-24

## 2020-07-28 DIAGNOSIS — I10 Essential (primary) hypertension: Principal | ICD-10-CM

## 2020-07-28 MED ORDER — LISINOPRIL 5 MG TABLET
ORAL_TABLET | Freq: Every day | ORAL | 0 refills | 90 days | Status: CP
Start: 2020-07-28 — End: 2021-07-28

## 2020-07-31 ENCOUNTER — Ambulatory Visit: Admit: 2020-07-31 | Payer: MEDICARE

## 2020-11-02 ENCOUNTER — Institutional Professional Consult (permissible substitution): Admit: 2020-11-02 | Discharge: 2020-11-03 | Payer: MEDICARE

## 2020-11-02 DIAGNOSIS — Z0000 Encounter for general adult medical examination without abnormal findings: Principal | ICD-10-CM

## 2020-11-10 DIAGNOSIS — I10 Essential (primary) hypertension: Principal | ICD-10-CM

## 2020-11-10 DIAGNOSIS — F32A Depression, unspecified depression type: Principal | ICD-10-CM

## 2020-11-10 MED ORDER — CITALOPRAM 40 MG TABLET
ORAL_TABLET | Freq: Every day | ORAL | 0 refills | 90 days
Start: 2020-11-10 — End: 2021-11-10

## 2020-11-10 MED ORDER — LISINOPRIL 5 MG TABLET
ORAL_TABLET | Freq: Every day | ORAL | 0 refills | 90 days | Status: CP
Start: 2020-11-10 — End: 2021-11-10

## 2020-11-12 MED ORDER — CITALOPRAM 40 MG TABLET
ORAL_TABLET | Freq: Every day | ORAL | 0 refills | 90 days | Status: CP
Start: 2020-11-12 — End: 2021-11-12

## 2020-12-28 DIAGNOSIS — E785 Hyperlipidemia, unspecified: Principal | ICD-10-CM

## 2020-12-28 MED ORDER — ATORVASTATIN 20 MG TABLET
ORAL_TABLET | Freq: Every evening | ORAL | 0 refills | 90 days | Status: CP
Start: 2020-12-28 — End: 2021-12-28

## 2021-01-25 DIAGNOSIS — F32A Depression, unspecified depression type: Principal | ICD-10-CM

## 2021-01-25 MED ORDER — CITALOPRAM 40 MG TABLET
ORAL_TABLET | 0 refills | 0 days | Status: CP
Start: 2021-01-25 — End: ?

## 2021-02-05 ENCOUNTER — Ambulatory Visit: Admit: 2021-02-05 | Discharge: 2021-02-06 | Payer: MEDICARE

## 2021-02-05 DIAGNOSIS — B182 Chronic viral hepatitis C: Principal | ICD-10-CM

## 2021-02-05 DIAGNOSIS — E785 Hyperlipidemia, unspecified: Principal | ICD-10-CM

## 2021-02-05 DIAGNOSIS — D7589 Other specified diseases of blood and blood-forming organs: Principal | ICD-10-CM

## 2021-02-05 DIAGNOSIS — I10 Essential (primary) hypertension: Principal | ICD-10-CM

## 2021-02-05 DIAGNOSIS — E079 Disorder of thyroid, unspecified: Principal | ICD-10-CM

## 2021-02-05 DIAGNOSIS — E669 Obesity, unspecified: Principal | ICD-10-CM

## 2021-02-05 DIAGNOSIS — F1010 Alcohol abuse, uncomplicated: Principal | ICD-10-CM

## 2021-02-05 DIAGNOSIS — F32A Depression, unspecified depression type: Principal | ICD-10-CM

## 2021-02-05 DIAGNOSIS — E559 Vitamin D deficiency, unspecified: Principal | ICD-10-CM

## 2021-02-05 MED ORDER — CITALOPRAM 40 MG TABLET
ORAL_TABLET | Freq: Every day | ORAL | 3 refills | 180 days | Status: CP
Start: 2021-02-05 — End: ?

## 2021-03-05 DIAGNOSIS — I10 Essential (primary) hypertension: Principal | ICD-10-CM

## 2021-03-05 MED ORDER — LISINOPRIL 5 MG TABLET
ORAL_TABLET | Freq: Every day | ORAL | 1 refills | 90 days | Status: CP
Start: 2021-03-05 — End: 2022-03-05

## 2021-03-05 MED ORDER — LEVOTHYROXINE 25 MCG TABLET
ORAL_TABLET | Freq: Every day | ORAL | 1 refills | 90 days | Status: CP
Start: 2021-03-05 — End: 2022-03-05

## 2021-05-28 DIAGNOSIS — F32A Depression, unspecified depression type: Principal | ICD-10-CM

## 2021-05-28 MED ORDER — CITALOPRAM 40 MG TABLET
ORAL_TABLET | 3 refills | 0 days
Start: 2021-05-28 — End: ?

## 2021-06-21 DIAGNOSIS — F32A Depression, unspecified depression type: Principal | ICD-10-CM

## 2021-06-21 MED ORDER — CITALOPRAM 40 MG TABLET
ORAL_TABLET | Freq: Every day | ORAL | 1 refills | 90 days | Status: CP
Start: 2021-06-21 — End: 2022-06-22

## 2021-08-06 ENCOUNTER — Ambulatory Visit: Admit: 2021-08-06 | Payer: MEDICARE

## 2021-08-08 ENCOUNTER — Institutional Professional Consult (permissible substitution): Admit: 2021-08-08 | Discharge: 2021-08-09 | Payer: MEDICARE

## 2021-08-08 DIAGNOSIS — F1010 Alcohol abuse, uncomplicated: Principal | ICD-10-CM

## 2021-08-08 DIAGNOSIS — Z7189 Other specified counseling: Principal | ICD-10-CM

## 2021-08-08 DIAGNOSIS — I10 Essential (primary) hypertension: Principal | ICD-10-CM

## 2021-08-08 DIAGNOSIS — E559 Vitamin D deficiency, unspecified: Principal | ICD-10-CM

## 2021-08-08 DIAGNOSIS — F32A Depression, unspecified depression type: Principal | ICD-10-CM

## 2021-08-08 DIAGNOSIS — E785 Hyperlipidemia, unspecified: Principal | ICD-10-CM

## 2021-08-08 MED ORDER — ATORVASTATIN 20 MG TABLET
ORAL_TABLET | Freq: Every evening | ORAL | 1 refills | 90 days | Status: CP
Start: 2021-08-08 — End: 2022-08-08

## 2021-08-08 MED ORDER — CITALOPRAM 40 MG TABLET
ORAL_TABLET | Freq: Every day | ORAL | 1 refills | 90.00000 days | Status: CP
Start: 2021-08-08 — End: 2022-08-09

## 2021-09-27 DIAGNOSIS — I10 Essential (primary) hypertension: Principal | ICD-10-CM

## 2021-09-27 MED ORDER — LISINOPRIL 5 MG TABLET
ORAL_TABLET | Freq: Every day | ORAL | 1 refills | 90 days | Status: CP
Start: 2021-09-27 — End: 2022-09-27

## 2021-10-22 MED ORDER — LEVOTHYROXINE 25 MCG TABLET
ORAL_TABLET | 1 refills | 0 days | Status: CP
Start: 2021-10-22 — End: ?

## 2021-12-06 DIAGNOSIS — L304 Erythema intertrigo: Principal | ICD-10-CM

## 2021-12-06 MED ORDER — NYAMYC 100,000 UNIT/GRAM TOPICAL POWDER
2 refills | 0 days
Start: 2021-12-06 — End: ?

## 2021-12-07 MED ORDER — NYSTATIN 100,000 UNIT/GRAM TOPICAL POWDER
2 refills | 0 days | Status: CP
Start: 2021-12-07 — End: 2022-12-08

## 2022-02-12 ENCOUNTER — Institutional Professional Consult (permissible substitution): Admit: 2022-02-12 | Discharge: 2022-02-13 | Payer: MEDICARE

## 2022-02-12 DIAGNOSIS — F1010 Alcohol abuse, uncomplicated: Principal | ICD-10-CM

## 2022-02-12 DIAGNOSIS — F32A Depression, unspecified depression type: Principal | ICD-10-CM

## 2022-02-12 DIAGNOSIS — I10 Essential (primary) hypertension: Principal | ICD-10-CM

## 2022-02-12 DIAGNOSIS — R5381 Other malaise: Principal | ICD-10-CM

## 2022-02-12 DIAGNOSIS — L304 Erythema intertrigo: Principal | ICD-10-CM

## 2022-02-12 DIAGNOSIS — E079 Disorder of thyroid, unspecified: Principal | ICD-10-CM

## 2022-02-12 DIAGNOSIS — E785 Hyperlipidemia, unspecified: Principal | ICD-10-CM

## 2022-02-12 DIAGNOSIS — Z7409 Other reduced mobility: Principal | ICD-10-CM

## 2022-02-12 DIAGNOSIS — E559 Vitamin D deficiency, unspecified: Principal | ICD-10-CM

## 2022-02-12 MED ORDER — NYSTATIN 100,000 UNIT/GRAM TOPICAL CREAM
Freq: Two times a day (BID) | TOPICAL | 2 refills | 0 days | Status: CP
Start: 2022-02-12 — End: 2023-02-12

## 2022-02-12 MED ORDER — CITALOPRAM 40 MG TABLET
ORAL_TABLET | Freq: Every day | ORAL | 3 refills | 90 days | Status: CP
Start: 2022-02-12 — End: 2023-02-13

## 2022-02-12 MED ORDER — ATORVASTATIN 20 MG TABLET
ORAL_TABLET | Freq: Every evening | ORAL | 3 refills | 90 days | Status: CP
Start: 2022-02-12 — End: 2023-02-12

## 2022-02-12 MED ORDER — LISINOPRIL 10 MG TABLET
ORAL_TABLET | Freq: Every day | ORAL | 1 refills | 90 days | Status: CP
Start: 2022-02-12 — End: 2023-02-12

## 2022-03-22 ENCOUNTER — Inpatient Hospital Stay
Admission: EM | Admit: 2022-03-22 | Discharge: 2022-03-25 | DRG: 193 | Disposition: A | Discharge status: Home Health | Payer: Medicare PPO | Attending: Hospitalist | Admitting: Hospitalist

## 2022-03-22 DIAGNOSIS — E669 Obesity, unspecified: Secondary | ICD-10-CM | POA: Diagnosis present

## 2022-03-22 DIAGNOSIS — F32A Depression, unspecified: Secondary | ICD-10-CM | POA: Diagnosis present

## 2022-03-22 DIAGNOSIS — R0902 Hypoxemia: Principal | ICD-10-CM

## 2022-03-22 DIAGNOSIS — Z888 Allergy status to other drugs, medicaments and biological substances status: Secondary | ICD-10-CM

## 2022-03-22 DIAGNOSIS — A419 Sepsis, unspecified organism: Secondary | ICD-10-CM

## 2022-03-22 DIAGNOSIS — Z1152 Encounter for screening for COVID-19: Secondary | ICD-10-CM

## 2022-03-22 DIAGNOSIS — Z8673 Personal history of transient ischemic attack (TIA), and cerebral infarction without residual deficits: Secondary | ICD-10-CM

## 2022-03-22 DIAGNOSIS — Z91018 Allergy to other foods: Secondary | ICD-10-CM

## 2022-03-22 DIAGNOSIS — J189 Pneumonia, unspecified organism: Principal | ICD-10-CM | POA: Diagnosis present

## 2022-03-22 DIAGNOSIS — J45909 Unspecified asthma, uncomplicated: Secondary | ICD-10-CM | POA: Diagnosis present

## 2022-03-22 DIAGNOSIS — Z9841 Cataract extraction status, right eye: Secondary | ICD-10-CM

## 2022-03-22 DIAGNOSIS — F1090 Alcohol use, unspecified, uncomplicated: Secondary | ICD-10-CM | POA: Insufficient documentation

## 2022-03-22 DIAGNOSIS — R531 Weakness: Secondary | ICD-10-CM

## 2022-03-22 DIAGNOSIS — N390 Urinary tract infection, site not specified: Secondary | ICD-10-CM

## 2022-03-22 DIAGNOSIS — Z961 Presence of intraocular lens: Secondary | ICD-10-CM | POA: Diagnosis present

## 2022-03-22 DIAGNOSIS — J9601 Acute respiratory failure with hypoxia: Secondary | ICD-10-CM | POA: Diagnosis present

## 2022-03-22 DIAGNOSIS — E785 Hyperlipidemia, unspecified: Secondary | ICD-10-CM | POA: Diagnosis present

## 2022-03-22 DIAGNOSIS — Z6831 Body mass index (BMI) 31.0-31.9, adult: Secondary | ICD-10-CM

## 2022-03-22 DIAGNOSIS — Z7982 Long term (current) use of aspirin: Secondary | ICD-10-CM

## 2022-03-22 DIAGNOSIS — E039 Hypothyroidism, unspecified: Secondary | ICD-10-CM | POA: Insufficient documentation

## 2022-03-22 DIAGNOSIS — F1010 Alcohol abuse, uncomplicated: Secondary | ICD-10-CM | POA: Diagnosis present

## 2022-03-22 DIAGNOSIS — B182 Chronic viral hepatitis C: Secondary | ICD-10-CM | POA: Diagnosis present

## 2022-03-22 DIAGNOSIS — Z79899 Other long term (current) drug therapy: Secondary | ICD-10-CM

## 2022-03-22 DIAGNOSIS — I10 Essential (primary) hypertension: Secondary | ICD-10-CM | POA: Diagnosis present

## 2022-03-22 MED ORDER — ONDANSETRON HCL 4 MG/2ML IJ SOLN
4.0000 mg | Freq: Once | INTRAMUSCULAR | Status: AC
  Administered 2022-03-23: 4 mg via INTRAVENOUS
  Filled 2022-03-22: qty 2

## 2022-03-22 MED ORDER — LACTATED RINGERS IV BOLUS
1000.0000 mL | Freq: Once | INTRAVENOUS | Status: AC
  Administered 2022-03-23: 1000 mL via INTRAVENOUS

## 2022-03-23 ENCOUNTER — Emergency Department: Payer: Medicare PPO

## 2022-03-23 DIAGNOSIS — R0902 Hypoxemia: Secondary | ICD-10-CM | POA: Diagnosis present

## 2022-03-23 DIAGNOSIS — Z7982 Long term (current) use of aspirin: Secondary | ICD-10-CM | POA: Diagnosis not present

## 2022-03-23 DIAGNOSIS — E669 Obesity, unspecified: Secondary | ICD-10-CM | POA: Diagnosis present

## 2022-03-23 DIAGNOSIS — Z1152 Encounter for screening for COVID-19: Secondary | ICD-10-CM | POA: Diagnosis not present

## 2022-03-23 DIAGNOSIS — R531 Weakness: Secondary | ICD-10-CM

## 2022-03-23 DIAGNOSIS — Z79899 Other long term (current) drug therapy: Secondary | ICD-10-CM | POA: Diagnosis not present

## 2022-03-23 DIAGNOSIS — Z9841 Cataract extraction status, right eye: Secondary | ICD-10-CM | POA: Diagnosis not present

## 2022-03-23 DIAGNOSIS — J189 Pneumonia, unspecified organism: Secondary | ICD-10-CM | POA: Diagnosis present

## 2022-03-23 DIAGNOSIS — B182 Chronic viral hepatitis C: Secondary | ICD-10-CM | POA: Diagnosis present

## 2022-03-23 DIAGNOSIS — E039 Hypothyroidism, unspecified: Secondary | ICD-10-CM | POA: Insufficient documentation

## 2022-03-23 DIAGNOSIS — Z888 Allergy status to other drugs, medicaments and biological substances status: Secondary | ICD-10-CM | POA: Diagnosis not present

## 2022-03-23 DIAGNOSIS — J45909 Unspecified asthma, uncomplicated: Secondary | ICD-10-CM | POA: Diagnosis present

## 2022-03-23 DIAGNOSIS — N390 Urinary tract infection, site not specified: Secondary | ICD-10-CM

## 2022-03-23 DIAGNOSIS — E785 Hyperlipidemia, unspecified: Secondary | ICD-10-CM | POA: Diagnosis present

## 2022-03-23 DIAGNOSIS — F32A Depression, unspecified: Secondary | ICD-10-CM | POA: Diagnosis present

## 2022-03-23 DIAGNOSIS — Z961 Presence of intraocular lens: Secondary | ICD-10-CM | POA: Diagnosis present

## 2022-03-23 DIAGNOSIS — F1090 Alcohol use, unspecified, uncomplicated: Secondary | ICD-10-CM | POA: Insufficient documentation

## 2022-03-23 DIAGNOSIS — Z6831 Body mass index (BMI) 31.0-31.9, adult: Secondary | ICD-10-CM | POA: Diagnosis not present

## 2022-03-23 DIAGNOSIS — Z91018 Allergy to other foods: Secondary | ICD-10-CM | POA: Diagnosis not present

## 2022-03-23 DIAGNOSIS — I10 Essential (primary) hypertension: Secondary | ICD-10-CM | POA: Diagnosis present

## 2022-03-23 DIAGNOSIS — Z8673 Personal history of transient ischemic attack (TIA), and cerebral infarction without residual deficits: Secondary | ICD-10-CM | POA: Diagnosis not present

## 2022-03-23 DIAGNOSIS — F1010 Alcohol abuse, uncomplicated: Secondary | ICD-10-CM | POA: Diagnosis present

## 2022-03-23 DIAGNOSIS — J9601 Acute respiratory failure with hypoxia: Secondary | ICD-10-CM | POA: Diagnosis present

## 2022-03-23 LAB — CBC WITH DIFFERENTIAL/PLATELET
Abs Immature Granulocytes: 0.14 10*3/uL — ABNORMAL HIGH (ref 0.00–0.07)
Abs Immature Granulocytes: 0.18 10*3/uL — ABNORMAL HIGH (ref 0.00–0.07)
Basophils Absolute: 0.1 10*3/uL (ref 0.0–0.1)
Basophils Absolute: 0.1 10*3/uL (ref 0.0–0.1)
Basophils Relative: 0 %
Basophils Relative: 0 %
Eosinophils Absolute: 0 10*3/uL (ref 0.0–0.5)
Eosinophils Absolute: 0.1 10*3/uL (ref 0.0–0.5)
Eosinophils Relative: 0 %
Eosinophils Relative: 0 %
HCT: 39.4 % (ref 36.0–46.0)
HCT: 45.4 % (ref 36.0–46.0)
Hemoglobin: 13.3 g/dL (ref 12.0–15.0)
Hemoglobin: 15.4 g/dL — ABNORMAL HIGH (ref 12.0–15.0)
Immature Granulocytes: 1 %
Immature Granulocytes: 1 %
Lymphocytes Relative: 4 %
Lymphocytes Relative: 5 %
Lymphs Abs: 0.7 10*3/uL (ref 0.7–4.0)
Lymphs Abs: 1.2 10*3/uL (ref 0.7–4.0)
MCH: 34.2 pg — ABNORMAL HIGH (ref 26.0–34.0)
MCH: 34.9 pg — ABNORMAL HIGH (ref 26.0–34.0)
MCHC: 33.8 g/dL (ref 30.0–36.0)
MCHC: 33.9 g/dL (ref 30.0–36.0)
MCV: 100.9 fL — ABNORMAL HIGH (ref 80.0–100.0)
MCV: 103.4 fL — ABNORMAL HIGH (ref 80.0–100.0)
Monocytes Absolute: 0.7 10*3/uL (ref 0.1–1.0)
Monocytes Absolute: 1.2 10*3/uL — ABNORMAL HIGH (ref 0.1–1.0)
Monocytes Relative: 4 %
Monocytes Relative: 5 %
Neutro Abs: 17.8 10*3/uL — ABNORMAL HIGH (ref 1.7–7.7)
Neutro Abs: 21.4 10*3/uL — ABNORMAL HIGH (ref 1.7–7.7)
Neutrophils Relative %: 89 %
Neutrophils Relative %: 91 %
Platelets: 209 10*3/uL (ref 150–400)
Platelets: 219 10*3/uL (ref 150–400)
RBC: 3.81 MIL/uL — ABNORMAL LOW (ref 3.87–5.11)
RBC: 4.5 MIL/uL (ref 3.87–5.11)
RDW: 13.2 % (ref 11.5–15.5)
RDW: 13.4 % (ref 11.5–15.5)
WBC: 19.6 10*3/uL — ABNORMAL HIGH (ref 4.0–10.5)
WBC: 24 10*3/uL — ABNORMAL HIGH (ref 4.0–10.5)
nRBC: 0 % (ref 0.0–0.2)
nRBC: 0 % (ref 0.0–0.2)

## 2022-03-23 LAB — CBC
HCT: 39.2 % (ref 36.0–46.0)
Hemoglobin: 13.2 g/dL (ref 12.0–15.0)
MCH: 34.6 pg — ABNORMAL HIGH (ref 26.0–34.0)
MCHC: 33.7 g/dL (ref 30.0–36.0)
MCV: 102.6 fL — ABNORMAL HIGH (ref 80.0–100.0)
Platelets: 198 10*3/uL (ref 150–400)
RBC: 3.82 MIL/uL — ABNORMAL LOW (ref 3.87–5.11)
RDW: 13.4 % (ref 11.5–15.5)
WBC: 23.8 10*3/uL — ABNORMAL HIGH (ref 4.0–10.5)
nRBC: 0 % (ref 0.0–0.2)

## 2022-03-23 LAB — RESP PANEL BY RT-PCR (RSV, FLU A&B, COVID)  RVPGX2
Influenza A by PCR: NEGATIVE
Influenza B by PCR: NEGATIVE
Resp Syncytial Virus by PCR: NEGATIVE
SARS Coronavirus 2 by RT PCR: NEGATIVE

## 2022-03-23 LAB — PROTIME-INR
INR: 1.2 (ref 0.8–1.2)
Prothrombin Time: 14.8 seconds (ref 11.4–15.2)

## 2022-03-23 LAB — COMPREHENSIVE METABOLIC PANEL
ALT: 16 U/L (ref 0–44)
AST: 23 U/L (ref 15–41)
Albumin: 4.2 g/dL (ref 3.5–5.0)
Alkaline Phosphatase: 45 U/L (ref 38–126)
Anion gap: 14 (ref 5–15)
BUN: 10 mg/dL (ref 8–23)
CO2: 23 mmol/L (ref 22–32)
Calcium: 9.2 mg/dL (ref 8.9–10.3)
Chloride: 102 mmol/L (ref 98–111)
Creatinine, Ser: 0.7 mg/dL (ref 0.44–1.00)
GFR, Estimated: >60 mL/min (ref >60)
Glucose, Bld: 118 mg/dL — ABNORMAL HIGH (ref 70–99)
Potassium: 3.5 mmol/L (ref 3.5–5.1)
Sodium: 139 mmol/L (ref 135–145)
Total Bilirubin: 1.2 mg/dL (ref 0.3–1.2)
Total Protein: 7.5 g/dL (ref 6.5–8.1)

## 2022-03-23 LAB — URINALYSIS, COMPLETE (UACMP) WITH MICROSCOPIC
Bilirubin Urine: NEGATIVE
Glucose, UA: NEGATIVE mg/dL
Ketones, ur: NEGATIVE mg/dL
Leukocytes,Ua: NEGATIVE
Nitrite: NEGATIVE
Protein, ur: NEGATIVE mg/dL
Specific Gravity, Urine: 1.025 (ref 1.005–1.030)
pH: 5 (ref 5.0–8.0)

## 2022-03-23 LAB — LACTIC ACID, PLASMA
Lactic Acid, Venous: 2.4 mmol/L (ref 0.5–1.9)
Lactic Acid, Venous: 1.8 mmol/L (ref 0.5–1.9)

## 2022-03-23 LAB — CREATININE, SERUM
Creatinine, Ser: 0.73 mg/dL (ref 0.44–1.00)
GFR, Estimated: >60 mL/min (ref >60)

## 2022-03-23 LAB — TROPONIN I (HIGH SENSITIVITY)
Troponin I (High Sensitivity): 26 ng/L — ABNORMAL HIGH (ref <18)
Troponin I (High Sensitivity): 27 ng/L — ABNORMAL HIGH (ref <18)

## 2022-03-23 LAB — CORTISOL-AM, BLOOD: Cortisol - AM: 8.9 ug/dL (ref 6.7–22.6)

## 2022-03-23 LAB — PROCALCITONIN
Procalcitonin: 2.32 ng/mL
Procalcitonin: 5.57 ng/mL

## 2022-03-23 LAB — APTT: aPTT: 23 seconds — ABNORMAL LOW (ref 24–36)

## 2022-03-23 MED ORDER — ACETAMINOPHEN 325 MG PO TABS: 650.0000 mg | ORAL_TABLET | Freq: Four times a day (QID) | ORAL | Status: DC | PRN

## 2022-03-23 MED ORDER — SODIUM CHLORIDE 0.9 % IV SOLN
1.0000 g | INTRAVENOUS | Status: DC
  Administered 2022-03-24 – 2022-03-25 (×2): 1 g via INTRAVENOUS
  Filled 2022-03-23: qty 1
  Filled 2022-03-23 (×2): qty 10

## 2022-03-23 MED ORDER — ATORVASTATIN CALCIUM 20 MG PO TABS
20.0000 mg | ORAL_TABLET | Freq: Every day | ORAL | Status: DC
  Administered 2022-03-23 – 2022-03-25 (×3): 20 mg via ORAL
  Filled 2022-03-23 (×3): qty 1

## 2022-03-23 MED ORDER — AZITHROMYCIN 250 MG PO TABS
500.0000 mg | ORAL_TABLET | Freq: Every day | ORAL | Status: DC
  Administered 2022-03-24 – 2022-03-25 (×2): 500 mg via ORAL
  Filled 2022-03-23 (×2): qty 2

## 2022-03-23 MED ORDER — LORAZEPAM 2 MG/ML IJ SOLN: 1.0000 mg | INTRAMUSCULAR | Status: DC | PRN

## 2022-03-23 MED ORDER — HYDROCODONE-ACETAMINOPHEN 5-325 MG PO TABS: 1.0000 | ORAL_TABLET | ORAL | Status: DC | PRN

## 2022-03-23 MED ORDER — IOHEXOL 350 MG/ML SOLN
75.0000 mL | Freq: Once | INTRAVENOUS | Status: AC | PRN
  Administered 2022-03-23: 75 mL via INTRAVENOUS

## 2022-03-23 MED ORDER — SODIUM CHLORIDE 0.9 % IV SOLN: 2.0000 g | INTRAVENOUS | Status: DC

## 2022-03-23 MED ORDER — ACETAMINOPHEN 650 MG RE SUPP: 650.0000 mg | Freq: Four times a day (QID) | RECTAL | Status: DC | PRN

## 2022-03-23 MED ORDER — THIAMINE MONONITRATE 100 MG PO TABS
100.0000 mg | ORAL_TABLET | Freq: Every day | ORAL | Status: DC
  Administered 2022-03-23 – 2022-03-25 (×3): 100 mg via ORAL
  Filled 2022-03-23 (×3): qty 1

## 2022-03-23 MED ORDER — ONDANSETRON HCL 4 MG PO TABS: 4.0000 mg | ORAL_TABLET | Freq: Four times a day (QID) | ORAL | Status: DC | PRN

## 2022-03-23 MED ORDER — LACTATED RINGERS IV SOLN: INTRAVENOUS | Status: AC

## 2022-03-23 MED ORDER — THIAMINE HCL 100 MG/ML IJ SOLN
100.0000 mg | Freq: Every day | INTRAMUSCULAR | Status: DC
  Filled 2022-03-23 (×2): qty 2

## 2022-03-23 MED ORDER — ENOXAPARIN SODIUM 60 MG/0.6ML IJ SOSY
0.5000 mg/kg | PREFILLED_SYRINGE | INTRAMUSCULAR | Status: DC
  Administered 2022-03-23 – 2022-03-25 (×3): 45 mg via SUBCUTANEOUS
  Filled 2022-03-23 (×3): qty 0.6

## 2022-03-23 MED ORDER — FOLIC ACID 1 MG PO TABS
1.0000 mg | ORAL_TABLET | Freq: Every day | ORAL | Status: DC
  Administered 2022-03-23 – 2022-03-25 (×3): 1 mg via ORAL
  Filled 2022-03-23 (×3): qty 1

## 2022-03-23 MED ORDER — ADULT MULTIVITAMIN W/MINERALS CH
1.0000 | ORAL_TABLET | Freq: Every day | ORAL | Status: DC
  Administered 2022-03-23 – 2022-03-24 (×2): 1 via ORAL
  Filled 2022-03-23 (×3): qty 1

## 2022-03-23 MED ORDER — SODIUM CHLORIDE 0.9 % IV SOLN
500.0000 mg | Freq: Once | INTRAVENOUS | Status: AC
  Administered 2022-03-23: 500 mg via INTRAVENOUS
  Filled 2022-03-23: qty 5

## 2022-03-23 MED ORDER — LISINOPRIL 10 MG PO TABS
5.0000 mg | ORAL_TABLET | Freq: Every day | ORAL | Status: DC
  Administered 2022-03-23 – 2022-03-25 (×3): 5 mg via ORAL
  Filled 2022-03-23 (×3): qty 1

## 2022-03-23 MED ORDER — ASPIRIN 81 MG PO TBEC
81.0000 mg | DELAYED_RELEASE_TABLET | Freq: Every day | ORAL | Status: DC
  Administered 2022-03-23 – 2022-03-25 (×3): 81 mg via ORAL
  Filled 2022-03-23 (×3): qty 1

## 2022-03-23 MED ORDER — SODIUM CHLORIDE 0.9 % IV SOLN
2.0000 g | Freq: Once | INTRAVENOUS | Status: AC
  Administered 2022-03-23: 2 g via INTRAVENOUS
  Filled 2022-03-23: qty 20

## 2022-03-23 MED ORDER — LACTATED RINGERS IV SOLN: INTRAVENOUS | Status: DC

## 2022-03-23 MED ORDER — ONDANSETRON HCL 4 MG/2ML IJ SOLN: 4.0000 mg | Freq: Four times a day (QID) | INTRAMUSCULAR | Status: DC | PRN

## 2022-03-23 MED ORDER — LORAZEPAM 1 MG PO TABS: 1.0000 mg | ORAL_TABLET | ORAL | Status: DC | PRN

## 2022-03-23 NOTE — Assessment & Plan Note (Signed)
Status posttreatment, followed by [facility] liver clinic No acute issues suspected

## 2022-03-23 NOTE — Progress Notes (Signed)
Anticoagulation monitoring(Lovenox):  81 yo female ordered Lovenox 40 mg Q24h    Filed Weights   03/22/22 2352  Weight: 90.9 kg (200 lb 6.4 oz)   BMI 32.3    Lab Results  Component Value Date   CREATININE 0.70 03/23/2022   CREATININE 0.54 (L) 03/15/2013   CREATININE 0.57 (L) 11/11/2011   Estimated Creatinine Clearance: 62.6 mL/min (by C-G formula based on SCr of 0.7 mg/dL). Hemoglobin & Hematocrit     Component Value Date/Time   HGB 15.4 (H) 03/23/2022 0015   HGB 16.7 (H) 03/15/2013 1041   HCT 45.4 03/23/2022 0015   HCT 51.6 (H) 03/15/2013 1041     Per Protocol for Patient with estCrcl > 30 ml/min and BMI > 30, will transition to Lovenox 45 mg Q24h.

## 2022-03-23 NOTE — ED Notes (Signed)
Pt O2 de-stated to 87% on room air during trial, pt is placed back on 2L [place].

## 2022-03-23 NOTE — Assessment & Plan Note (Signed)
Patient has no focal neurologic deficit CT head nonacute Continue aspirin and statins

## 2022-03-23 NOTE — H&P (Signed)
History and Physical    Patient: Alyssa Pitts VZS:[id number] DOB: 1940-07-17 DOA: 03/22/2022 DOS: the patient was seen and examined on 03/23/2022 PCP: System, Provider Not In  Patient coming from: Home  Chief Complaint: No chief complaint on file.   HPI: Alyssa Pitts is a 81 y.o. female with medical history significant for Chronic hepatitis C treated from 2020-2022, followed at [facility] liver clinic, HTN, HLD, hypothyroidism, depression, alcohol use disorder, osteoarthritis and history of TIAs who Presents to the ED with a 1 day history of generalized weakness, nausea, difficulty word finding and difficulty with speech.  She denies vomiting, abdominal pain, diarrhea or dysuria and denies cough or congestion, chest pain, palpitations or shortness of breath.  She did a home COVID test earlier in the day that was positive.  She was slightly hypoxic to 89-90 on room air with EMS and arrived on O2 at 2 L ED course and data review: Tachycardic to 111 on arrival and mildly tachypneic to 24 with otherwise normal vitals and with O2 sat 96% on 2 L. Labs with WBC 19,600 with procalcitonin 2.32 and lactic acid 1.8.  CMP unremarkable.  COVID flu and RSV negative.  Urinalysis showed many bacteria but negative nitrite and leukocyte esterases.  EKG, personally viewed and interpreted showed sinus tachycardia at 106 with no acute ST-T wave changes. Head CT showed an aging brain otherwise nonacute, chest x-ray nonacute CTA chest PE protocol pending Patient started on sepsis fluids and placed on Rocephin for possible UTI and hospitalist consulted for admission.   Review of Systems: As mentioned in the history of present illness. All other systems reviewed and are negative.  Past Medical History:  Diagnosis Date   Arthritis    Dental bridge present    permanant - top   Hepatitis    chronic hep c   Hypertension    Motion sickness    boats   TIA (transient ischemic attack) 05/2015   was off BP meds    Vertigo    last episode approx 5/17   Past Surgical History:  Procedure Laterality Date   ABDOMINAL HYSTERECTOMY     CATARACT EXTRACTION W/PHACO Right 12/18/2015   Procedure: CATARACT EXTRACTION PHACO AND INTRAOCULAR LENS PLACEMENT (IOC);  Surgeon: Sherald Hess, MD;  Location: [facility];  Service: Ophthalmology;  Laterality: Right;  RIGHT   TONSILLECTOMY     Social History:  reports that she has never smoked. She has never used smokeless tobacco. She reports current alcohol use of about 30.0 standard drinks of alcohol per week. No history on file for drug use.  Allergies  Allergen Reactions   Mango Butter Hives    Mango (not butter). Also caused lip numbness   Strawberry Extract Rash    No family history on file.  Prior to Admission medications   Medication Sig Start Date End Date Taking? Authorizing Provider  aspirin 81 MG tablet Take 81 mg by mouth daily.    [provider]  atorvastatin (LIPITOR) 20 MG tablet Take 20 mg by mouth daily.    [provider]  cetirizine (ZYRTEC) 10 MG tablet Take 10 mg by mouth daily.    [provider]  lisinopril (PRINIVIL,ZESTRIL) 5 MG tablet Take 5 mg by mouth daily.    [provider]  naproxen sodium (ANAPROX) 220 MG tablet Take 220 mg by mouth 2 (two) times daily with a meal.    [provider]    Physical Exam: Vitals:   03/23/22  0100 03/23/22 0130 03/23/22 0230 03/23/22 0430  BP:  125/62 103/67 125/62  Pulse: 93 90 86 79  Resp: (!) 21 (!) 23 (!) 24 19  Temp:    98.8 F (37.1 C)  TempSrc:    Oral  SpO2: 94% 96% 96% 96%  Weight:      Height:       Physical Exam Vitals and nursing note reviewed.  Constitutional:      General: She is not in acute distress. HENT:     Head: Normocephalic and atraumatic.  Cardiovascular:     Rate and Rhythm: Normal rate and regular rhythm.     Heart sounds: Normal heart sounds.  Pulmonary:     Effort: Tachypnea present.     Breath  sounds: Normal breath sounds.  Abdominal:     Palpations: Abdomen is soft.     Tenderness: There is no abdominal tenderness.  Neurological:     Mental Status: Mental status is at baseline.     Labs on Admission: I have personally reviewed following labs and imaging studies  CBC: Recent Labs  Lab 03/23/22 0015  WBC 19.6*  NEUTROABS 17.8*  HGB 15.4*  HCT 45.4  MCV 100.9*  PLT [id number]   Basic Metabolic Panel: Recent Labs  Lab 03/23/22 0015  NA 139  K 3.5  CL 102  CO2 23  GLUCOSE 118*  BUN 10  CREATININE 0.70  CALCIUM 9.2   GFR: Estimated Creatinine Clearance: 62.6 mL/min (by C-G formula based on SCr of 0.7 mg/dL). Liver Function Tests: Recent Labs  Lab 03/23/22 0015  AST 23  ALT 16  ALKPHOS 45  BILITOT 1.2  PROT 7.5  ALBUMIN 4.2   No results for input(s): "LIPASE", "AMYLASE" in the last 168 hours. No results for input(s): "AMMONIA" in the last 168 hours. Coagulation Profile: Recent Labs  Lab 03/23/22 0015  INR 1.2   Cardiac Enzymes: No results for input(s): "CKTOTAL", "CKMB", "CKMBINDEX", "TROPONINI" in the last 168 hours. BNP (last 3 results) No results for input(s): "PROBNP" in the last 8760 hours. HbA1C: No results for input(s): "HGBA1C" in the last 72 hours. CBG: No results for input(s): "GLUCAP" in the last 168 hours. Lipid Profile: No results for input(s): "CHOL", "HDL", "LDLCALC", "TRIG", "CHOLHDL", "LDLDIRECT" in the last 72 hours. Thyroid Function Tests: No results for input(s): "TSH", "T4TOTAL", "FREET4", "T3FREE", "THYROIDAB" in the last 72 hours. Anemia Panel: No results for input(s): "VITAMINB12", "FOLATE", "FERRITIN", "TIBC", "IRON", "RETICCTPCT" in the last 72 hours. Urine analysis:    Component Value Date/Time   COLORURINE YELLOW (A) 03/23/2022 0400   APPEARANCEUR HAZY (A) 03/23/2022 0400   APPEARANCEUR Hazy 11/11/2011 1935   LABSPEC 1.025 03/23/2022 0400   LABSPEC 1.020 11/11/2011 1935   PHURINE 5.0 03/23/2022 0400   GLUCOSEU  NEGATIVE 03/23/2022 0400   GLUCOSEU Negative 11/11/2011 1935   HGBUR SMALL (A) 03/23/2022 0400   BILIRUBINUR NEGATIVE 03/23/2022 0400   BILIRUBINUR Negative 11/11/2011 1935   KETONESUR NEGATIVE 03/23/2022 0400   PROTEINUR NEGATIVE 03/23/2022 0400   NITRITE NEGATIVE 03/23/2022 0400   LEUKOCYTESUR NEGATIVE 03/23/2022 0400   LEUKOCYTESUR Negative 11/11/2011 1935    Radiological Exams on Admission: CT HEAD WO CONTRAST (5MM)  Result Date: 03/23/2022 CLINICAL DATA:  Mental status change with unknown cause. COVID symptoms and hypoxia EXAM: CT HEAD WITHOUT CONTRAST TECHNIQUE: Contiguous axial images were obtained from the base of the skull through the vertex without intravenous contrast. RADIATION DOSE REDUCTION: This exam was performed according to the departmental dose-optimization program  which includes automated exposure control, adjustment of the mA and/or kV according to patient size and/or use of iterative reconstruction technique. COMPARISON:  03/15/2013 FINDINGS: Brain: No evidence of acute infarction, hemorrhage, hydrocephalus, extra-axial collection or mass lesion/mass effect. Cerebral volume loss and periventricular chronic small vessel ischemia in keeping with age. Vascular: No hyperdense vessel or unexpected calcification. Skull: Normal. Negative for fracture or focal lesion. Sinuses/Orbits: No acute finding. IMPRESSION: Aging brain without acute or reversible finding. Electronically Signed   By: Jorje Guild M.D.   On: 03/23/2022 04:07   DG Chest Port 1 View  Result Date: 03/23/2022 CLINICAL DATA:  COVID positive, hypoxemia EXAM: PORTABLE CHEST 1 VIEW COMPARISON:  None Available. FINDINGS: Stable mild elevation of the right hemidiaphragm when compared to CT examination of 11/12/2011. Right cardiophrenic angle opacity relates to prominent pericardial fat. Lungs are otherwise clear. No pneumothorax or pleural effusion. Cardiac size within normal limits. Pulmonary vascularity is normal.  No acute bone abnormality. Degenerative changes noted within the right shoulder. IMPRESSION: 1. No radiographic evidence of acute cardiopulmonary disease. Electronically Signed   By: Fidela Salisbury M.D.   On: 03/23/2022 00:37     Data Reviewed: Relevant notes from primary care and specialist visits, past discharge summaries as available in EHR, including Care Everywhere. Prior diagnostic testing as pertinent to current admission diagnoses Updated medications and problem lists for reconciliation ED course, including vitals, labs, imaging, treatment and response to treatment Triage notes, nursing and pharmacy notes and ED provider's notes Notable results as noted in HPI   Assessment and Plan: * Urinary tract infection Patient presents with weakness, has WBC 19,000 and procalcitonin 2.32, lactic acid 1.8 Continue Rocephin IV fluids Patient has borderline sepsis criteria so continue to monitor  Hypoxia Mild hypoxia 89-90 with EMS now in the high 90s on 2 L Follow CTA chest to evaluate for PE Continue O2 and wean as tolerated  Generalized weakness Likely secondary to UTI  Alcohol use disorder Patient drinks a couple glasses of wine a day CIWA withdrawal protocol  Hypothyroidism Continue levothyroxine  Hypertension Continue lisinopril  History of TIAs Patient has no focal neurologic deficit CT head nonacute Continue aspirin and statins  Chronic hepatitis C ([place]) Status posttreatment, followed by [facility] liver clinic No acute issues suspected  Depression Continue Prozac  Asthma without status asthmaticus Not acutely exacerbated Albuterol as needed        DVT prophylaxis: Lovenox  Consults: none  Advance Care Planning: dull code  Family Communication: none  Disposition Plan: Back to previous home environment  Severity of Illness: The appropriate patient status for this patient is INPATIENT. Inpatient status is judged to be reasonable and necessary in order to  provide the required intensity of service to ensure the patient's safety. The patient's presenting symptoms, physical exam findings, and initial radiographic and laboratory data in the context of their chronic comorbidities is felt to place them at high risk for further clinical deterioration. Furthermore, it is not anticipated that the patient will be medically stable for discharge from the hospital within 2 midnights of admission.   * I certify that at the point of admission it is my clinical judgment that the patient will require inpatient hospital care spanning beyond 2 midnights from the point of admission due to high intensity of service, high risk for further deterioration and high frequency of surveillance required.*  Author: Athena Masse, MD 03/23/2022 4:53 AM  For on call review [email or web address].

## 2022-03-23 NOTE — ED Notes (Signed)
2L [place] re-applied after MD removal. Saturation down to 89%. Recovered well to 97%.

## 2022-03-23 NOTE — Assessment & Plan Note (Signed)
Continue levothyroxine 

## 2022-03-23 NOTE — ED Notes (Signed)
Pt bed checked for wetness, purewick in place, pt warm and dry at this time.

## 2022-03-23 NOTE — Assessment & Plan Note (Signed)
Likely secondary to UTI

## 2022-03-23 NOTE — IPAL (Signed)
  Interdisciplinary Goals of Care Family Meeting   Date carried out: 03/23/2022  Location of the meeting: Bedside  Member's involved: Physician  Durable Power of Attorney or acting medical decision maker: patient    Discussion: We discussed goals of care for Alyssa Pitts .    Code status:   Code Status: Full Code   Disposition: Continue current acute care  Time spent for the meeting: 5    Andris Baumann, MD  03/23/2022, 5:16 AM

## 2022-03-23 NOTE — ED Provider Notes (Signed)
[facility] Provider Note    Event Date/Time   First MD Initiated Contact with Patient 03/22/22 2349     (approximate)   History   No chief complaint on file.   HPI  Alyssa Pitts is a 81 y.o. female who presents to the ED for evaluation of No chief complaint on file.   I reviewed clinic visit from 1 year ago.  Chronic hep C, depression, HTN, obesity and HLD.  Patient presents to the ED for evaluation of about 1 day of generalized weakness and nausea.  No focal symptoms such as particular areas of pain such as chest or abdominal pain.  Denies any urinary symptoms like dysuria or frequency.  Denies shortness of breath, cough or chest pain, but does report a home positive COVID antigen test earlier today and EMS does report that she was hypoxic on room air, which is novel for the patient, requiring 2 L nasal cannula.   Physical Exam   Triage Vital Signs: ED Triage Vitals  Enc Vitals Group     BP 03/22/22 2350 125/76     Pulse Rate 03/22/22 2350 (!) 111     Resp 03/22/22 2350 18     Temp 03/22/22 2350 99.9 F (37.7 C)     Temp src --      SpO2 03/22/22 2350 92 %     Weight 03/22/22 2352 200 lb 6.4 oz (90.9 kg)     Height 03/22/22 2352 5\' 6"  (1.676 m)     Head Circumference --      Peak Flow --      Pain Score 03/22/22 2352 0     Pain Loc --      Pain Edu? --      Excl. in GC? --     Most recent vital signs: Vitals:   03/23/22 0430 03/23/22 0600  BP: 125/62 106/67  Pulse: 79 77  Resp: 19 18  Temp: 98.8 F (37.1 C)   SpO2: 96% 96%    General: Awake, no distress.  Conversational, follows commands and moving all 4 CV:  Good peripheral perfusion.  RRR Resp:  Normal effort.  Minimal tachypnea to the low 20s Abd:  No distention.  Soft MSK:  No deformity noted.  Neuro:  No focal deficits appreciated. Cranial nerves II through XII intact 5/5 strength and sensation in all 4 extremities Other:     ED Results / Procedures / Treatments    Labs (all labs ordered are listed, but only abnormal results are displayed) Labs Reviewed  LACTIC ACID, PLASMA - Abnormal; Notable for the following components:      Result Value   Lactic Acid, Venous 2.4 (*)    All other components within normal limits  COMPREHENSIVE METABOLIC PANEL - Abnormal; Notable for the following components:   Glucose, Bld 118 (*)    All other components within normal limits  CBC WITH DIFFERENTIAL/PLATELET - Abnormal; Notable for the following components:   WBC 19.6 (*)    Hemoglobin 15.4 (*)    MCV 100.9 (*)    MCH 34.2 (*)    Neutro Abs 17.8 (*)    Abs Immature Granulocytes 0.18 (*)    All other components within normal limits  APTT - Abnormal; Notable for the following components:   aPTT 23 (*)    All other components within normal limits  URINALYSIS, COMPLETE (UACMP) WITH MICROSCOPIC - Abnormal; Notable for the following components:   Color, Urine YELLOW (*)  APPearance HAZY (*)    Hgb urine dipstick SMALL (*)    Bacteria, UA MANY (*)    All other components within normal limits  TROPONIN I (HIGH SENSITIVITY) - Abnormal; Notable for the following components:   Troponin I (High Sensitivity) 27 (*)    All other components within normal limits  TROPONIN I (HIGH SENSITIVITY) - Abnormal; Notable for the following components:   Troponin I (High Sensitivity) 26 (*)    All other components within normal limits  RESP PANEL BY RT-PCR (RSV, FLU A&B, COVID)  [id number]  CULTURE, BLOOD (ROUTINE X 2)  CULTURE, BLOOD (ROUTINE X 2)  URINE CULTURE  PROCALCITONIN  PROCALCITONIN  LACTIC ACID, PLASMA  PROTIME-INR  CORTISOL-AM, BLOOD  CBC  CREATININE, SERUM    EKG Sinus rhythm with a rate of 106 bpm.  Normal axis and intervals.  Nonspecific ST changes without clear ischemic features  RADIOLOGY CXR interpreted by me without evidence of acute cardiopulmonary pathology. CT head interpreted by me without evidence of acute intracranial pathology CT chest  interpreted by me with patchy infiltration concerning for pneumonia  Official radiology report(s): CT Angio Chest PE W and/or Wo Contrast  Result Date: 03/23/2022 CLINICAL DATA:  COVID symptoms.  Hypoxia and tachycardia. EXAM: CT ANGIOGRAPHY CHEST WITH CONTRAST TECHNIQUE: Multidetector CT imaging of the chest was performed using the standard protocol during bolus administration of intravenous contrast. Multiplanar CT image reconstructions and MIPs were obtained to evaluate the vascular anatomy. RADIATION DOSE REDUCTION: This exam was performed according to the departmental dose-optimization program which includes automated exposure control, adjustment of the mA and/or kV according to patient size and/or use of iterative reconstruction technique. CONTRAST:  75mL OMNIPAQUE IOHEXOL 350 MG/ML SOLN COMPARISON:  None Available. FINDINGS: Cardiovascular: Satisfactory opacification of the pulmonary arteries to the segmental level. No evidence of pulmonary embolism. Heart size is normal. Ascending thoracic aorta measures 4.1 cm, image 59/4. Aortic atherosclerosis and coronary artery calcifications. No pericardial effusion. Mediastinum/Nodes: No enlarged mediastinal, hilar, or axillary lymph nodes. Thyroid gland, trachea, and esophagus demonstrate no significant findings. Lungs/Pleura: No pleural fluid or airspace disease. Areas of subsegmental atelectasis and volume loss are noted within the lingula, right middle lobe and left lower lobe. Mild patchy ground-glass attenuation identified bilaterally, nonspecific. Calcified granuloma seen in the left lower lobe. Central airways appear patent. Upper Abdomen: No acute abnormality. Musculoskeletal: No chest wall abnormality. No acute or significant osseous findings. Review of the MIP images confirms the above findings. IMPRESSION: 1. No evidence for acute pulmonary embolism. 2. Mild patchy ground-glass attenuation identified bilaterally, nonspecific. Findings may reflect  mild atypical infection or inflammation. 3. Areas of subsegmental atelectasis and volume loss are noted within the lingula, right middle lobe and left lower lobe. 4. Coronary artery calcifications noted. 5. Ascending thoracic aortic aneurysm measuring 4.1 cm. Recommend annual imaging followup by CTA or MRA. This recommendation follows 2010 ACCF/AHA/AATS/ACR/ASA/SCA/SCAI/SIR/STS/SVM Guidelines for the Diagnosis and Management of Patients with Thoracic Aortic Disease. Circulation. 2010; 121: Z610-R604: E266-e369. Aortic aneurysm NOS (ICD10-I71.9) 6.  Aortic Atherosclerosis (ICD10-I70.0). Electronically Signed   By: Signa Kellaylor  Stroud M.D.   On: 03/23/2022 04:56   CT HEAD WO CONTRAST (5MM)  Result Date: 03/23/2022 CLINICAL DATA:  Mental status change with unknown cause. COVID symptoms and hypoxia EXAM: CT HEAD WITHOUT CONTRAST TECHNIQUE: Contiguous axial images were obtained from the base of the skull through the vertex without intravenous contrast. RADIATION DOSE REDUCTION: This exam was performed according to the departmental dose-optimization program which includes automated exposure control, adjustment  of the mA and/or kV according to patient size and/or use of iterative reconstruction technique. COMPARISON:  03/15/2013 FINDINGS: Brain: No evidence of acute infarction, hemorrhage, hydrocephalus, extra-axial collection or mass lesion/mass effect. Cerebral volume loss and periventricular chronic small vessel ischemia in keeping with age. Vascular: No hyperdense vessel or unexpected calcification. Skull: Normal. Negative for fracture or focal lesion. Sinuses/Orbits: No acute finding. IMPRESSION: Aging brain without acute or reversible finding. Electronically Signed   By: Tiburcio Pea M.D.   On: 03/23/2022 04:07   DG Chest Port 1 View  Result Date: 03/23/2022 CLINICAL DATA:  COVID positive, hypoxemia EXAM: PORTABLE CHEST 1 VIEW COMPARISON:  None Available. FINDINGS: Stable mild elevation of the right hemidiaphragm when  compared to CT examination of 11/12/2011. Right cardiophrenic angle opacity relates to prominent pericardial fat. Lungs are otherwise clear. No pneumothorax or pleural effusion. Cardiac size within normal limits. Pulmonary vascularity is normal. No acute bone abnormality. Degenerative changes noted within the right shoulder. IMPRESSION: 1. No radiographic evidence of acute cardiopulmonary disease. Electronically Signed   By: Helyn Numbers M.D.   On: 03/23/2022 00:37    PROCEDURES and INTERVENTIONS:  .1-3 Lead EKG Interpretation  Performed by: Delton Prairie, MD Authorized by: Delton Prairie, MD     Interpretation: normal     ECG rate:  90   ECG rate assessment: normal     Rhythm: sinus rhythm     Ectopy: none     Conduction: normal   .Critical Care  Performed by: Delton Prairie, MD Authorized by: Delton Prairie, MD   Critical care provider statement:    Critical care time (minutes):  30   Critical care time was exclusive of:  Separately billable procedures and treating other patients   Critical care was necessary to treat or prevent imminent or life-threatening deterioration of the following conditions:  Respiratory failure   Critical care was time spent personally by me on the following activities:  Development of treatment plan with patient or surrogate, discussions with consultants, evaluation of patient's response to treatment, examination of patient, ordering and review of laboratory studies, ordering and review of radiographic studies, ordering and performing treatments and interventions, pulse oximetry, re-evaluation of patient's condition and review of old charts   Medications  aspirin EC tablet 81 mg (has no administration in time range)  atorvastatin (LIPITOR) tablet 20 mg (has no administration in time range)  lisinopril (ZESTRIL) tablet 5 mg (has no administration in time range)  enoxaparin (LOVENOX) injection 45 mg (has no administration in time range)  lactated ringers infusion  (has no administration in time range)  cefTRIAXone (ROCEPHIN) 2 g in sodium chloride 0.9 % 100 mL IVPB (has no administration in time range)  acetaminophen (TYLENOL) tablet 650 mg (has no administration in time range)    Or  acetaminophen (TYLENOL) suppository 650 mg (has no administration in time range)  HYDROcodone-acetaminophen (NORCO/VICODIN) 5-325 MG per tablet 1-2 tablet (has no administration in time range)  ondansetron (ZOFRAN) tablet 4 mg (has no administration in time range)    Or  ondansetron (ZOFRAN) injection 4 mg (has no administration in time range)  LORazepam (ATIVAN) tablet 1-4 mg (has no administration in time range)    Or  LORazepam (ATIVAN) injection 1-4 mg (has no administration in time range)  thiamine (VITAMIN B1) tablet 100 mg (has no administration in time range)    Or  thiamine (VITAMIN B1) injection 100 mg (has no administration in time range)  folic acid (FOLVITE) tablet 1 mg (has  no administration in time range)  multivitamin with minerals tablet 1 tablet (has no administration in time range)  azithromycin (ZITHROMAX) 500 mg in sodium chloride 0.9 % 250 mL IVPB (has no administration in time range)  lactated ringers bolus 1,000 mL (0 mLs Intravenous Stopped 03/23/22 0300)  ondansetron (ZOFRAN) injection 4 mg (4 mg Intravenous Given 03/23/22 0030)  cefTRIAXone (ROCEPHIN) 2 g in sodium chloride 0.9 % 100 mL IVPB (0 g Intravenous Stopped 03/23/22 0325)  iohexol (OMNIPAQUE) 350 MG/ML injection 75 mL (75 mLs Intravenous Contrast Given 03/23/22 0338)     IMPRESSION / MDM / ASSESSMENT AND PLAN / ED COURSE  I reviewed the triage vital signs and the nursing notes.  Differential diagnosis includes, but is not limited to, sepsis, pneumonia, UTI, viral syndrome, dehydration, AKI  {Patient presents with symptoms of an acute illness or injury that is potentially life-threatening.  81 year old woman presents from home with generalized malaise, found to be hypoxic likely  due to pneumonia and requiring medical admission.  She is notably tachycardic, intermittently tachypneic and has a leukocytosis meeting SIRS criteria for sepsis.  Otherwise her blood work shows a slight elevation of troponin, no lactic acidosis.  Urine without infectious features so I doubt cystitis.  Procalcitonin is quite elevated, further suggestive of bacterial etiology of her disease.  While she reports testing positive for COVID at home on a home antigen test, her PCR test here is negative.  Overall her clinical picture is more concerning for bacterial etiology of community-acquired pneumonia.  Will initiate CAP coverage per protocol and consult medicine for admission.  No PE on CTA      FINAL CLINICAL IMPRESSION(S) / ED DIAGNOSES   Final diagnoses:  Hypoxia  Sepsis with acute hypoxic respiratory failure without septic shock, due to unspecified organism [facility])     Rx / DC Orders   ED Discharge Orders     None        Note:  This document was prepared using Dragon voice recognition software and may include unintentional dictation errors.   Delton Prairie, MD 03/23/22 [phone]

## 2022-03-23 NOTE — ED Notes (Signed)
O2 removed by MD... saturations are holding 97%.

## 2022-03-23 NOTE — Assessment & Plan Note (Signed)
Not acutely exacerbated.  Albuterol as needed 

## 2022-03-23 NOTE — Assessment & Plan Note (Signed)
Mild hypoxia 89-90 with EMS now in the high 90s on 2 L Follow CTA chest to evaluate for PE Continue O2 and wean as tolerated

## 2022-03-23 NOTE — Evaluation (Signed)
Physical Therapy Evaluation Patient Details Name: Alyssa Pitts MRN: [id number] DOB: 14-Feb-1941 Today's Date: 03/23/2022  History of Present Illness  Alyssa Pitts is a 81 y.o. female with medical history significant for Chronic hepatitis C treated from 2020-2022, followed at [facility] liver clinic, HTN, HLD, hypothyroidism, depression, alcohol use disorder, osteoarthritis and history of TIAs who Presents to the ED with a 1 day history of generalized weakness, nausea, difficulty word finding and difficulty with speech.  She denies vomiting, abdominal pain, diarrhea or dysuria and denies cough or congestion, chest pain, palpitations or shortness of breath.  She did a home COVID test earlier in the day that was positive.  She was slightly hypoxic to 89-90 on room air with EMS and arrived on O2 at 2 L  Clinical Impression  Pt is a pleasant 81 year old female who was admitted for weakness. Pt performs bed mobility with min assist and transfers/ambulation with cga and RW. Pt with good insight into deficits and is agreeable to RW recommendation. Encouraged pt to continue mobility efforts in room with staff assistance to avoid deconditioning. Pt demonstrates deficits with strength/mobility/balance. Family dynamics iffy-pt agreeable to possibly transition to ALF. Would benefit from skilled PT to address above deficits and promote optimal return to PLOF. Recommend transition to HHPT upon discharge from acute hospitalization. Vitals WNL throughout exertion.       Recommendations for follow up therapy are one component of a multi-disciplinary discharge planning process, led by the attending physician.  Recommendations may be updated based on patient status, additional functional criteria and insurance authorization.  Follow Up Recommendations Home health PT      Assistance Recommended at Discharge Set up Supervision/Assistance  Patient can return home with the following  A little help with walking and/or  transfers;Help with stairs or ramp for entrance    Equipment Recommendations Rolling walker (2 wheels)  Recommendations for Other Services       Functional Status Assessment Patient has had a recent decline in their functional status and demonstrates the ability to make significant improvements in function in a reasonable and predictable amount of time.     Precautions / Restrictions Precautions Precautions: Fall Restrictions Weight Bearing Restrictions: No      Mobility  Bed Mobility Overal bed mobility: Needs Assistance Bed Mobility: Supine to Sit     Supine to sit: Min assist     General bed mobility comments: needs slight assist for trunkal elevation. Once seated, able to sit with upright posture with supervision    Transfers Overall transfer level: Needs assistance Equipment used: Rolling walker (2 wheels) Transfers: Sit to/from Stand Sit to Stand: Min guard           General transfer comment: safe technique. No dizziness. RW used    Ambulation/Gait Ambulation/Gait assistance: Land (Feet): 10 Feet Assistive device: Rolling walker (2 wheels) Gait Pattern/deviations: Step-to pattern       General Gait Details: ambulated short distance in room with RW. Pt declined further distance secondary to fatigue. All mobility performed on RA with sats at 92%. Safe technique and no LOB noted  Stairs            Wheelchair Mobility    Modified Rankin (Stroke Patients Only)       Balance Overall balance assessment: Needs assistance Sitting-balance support: Feet unsupported Sitting balance-Leahy Scale: Good     Standing balance support: Bilateral upper extremity supported Standing balance-Leahy Scale: Good  Pertinent Vitals/Pain Pain Assessment Pain Assessment: No/denies pain    Home Living Family/patient expects to be discharged to:: Private residence Living Arrangements:   (roommate) Available Help at Discharge: Family;Available PRN/intermittently (daughter available PRN) Type of Home: Apartment Home Access: Level entry       Home Layout: One level Home Equipment: Cane - single point;Tub bench      Prior Function Prior Level of Function : Independent/Modified Independent             Mobility Comments: reports history of falls while using SPC. Reports just starting HHPT. ADLs Comments: Reports indep with ADLs     Hand Dominance        Extremity/Trunk Assessment   Upper Extremity Assessment Upper Extremity Assessment: Overall WFL for tasks assessed    Lower Extremity Assessment Lower Extremity Assessment: Overall WFL for tasks assessed       Communication   Communication: No difficulties  Cognition Arousal/Alertness: Awake/alert Behavior During Therapy: WFL for tasks assessed/performed Overall Cognitive Status: Within Functional Limits for tasks assessed                                          General Comments      Exercises     Assessment/Plan    PT Assessment Patient needs continued PT services  PT Problem List Decreased strength;Decreased activity tolerance;Decreased balance;Decreased mobility       PT Treatment Interventions DME instruction;Therapeutic exercise;Gait training    PT Goals (Current goals can be found in the Care Plan section)  Acute Rehab PT Goals Patient Stated Goal: to resume HHPT PT Goal Formulation: With patient Time For Goal Achievement: 04/06/22 Potential to Achieve Goals: Good    Frequency Min 2X/week     Co-evaluation               AM-PAC PT "6 Clicks" Mobility  Outcome Measure Help needed turning from your back to your side while in a flat bed without using bedrails?: A Little Help needed moving from lying on your back to sitting on the side of a flat bed without using bedrails?: A Little Help needed moving to and from a bed to a chair (including a  wheelchair)?: A Little Help needed standing up from a chair using your arms (e.g., wheelchair or bedside chair)?: A Little Help needed to walk in hospital room?: A Little Help needed climbing 3-5 steps with a railing? : A Little 6 Click Score: 18    End of Session Equipment Utilized During Treatment: Gait belt Activity Tolerance: Patient tolerated treatment well Patient left: in bed Nurse Communication: Mobility status PT Visit Diagnosis: Unsteadiness on feet (R26.81);Muscle weakness (generalized) (M62.81);Difficulty in walking, not elsewhere classified (R26.2)    Time: 4010-2725 PT Time Calculation (min) (ACUTE ONLY): 18 min   Charges:   PT Evaluation $PT Eval Low Complexity: 1 Low          Elizabeth Palau, PT, DPT, GCS [phone]   Field Staniszewski 03/23/2022, 3:53 PM

## 2022-03-23 NOTE — Assessment & Plan Note (Signed)
Continue Prozac

## 2022-03-23 NOTE — Assessment & Plan Note (Signed)
Continue lisinopril

## 2022-03-23 NOTE — Assessment & Plan Note (Signed)
Patient presents with weakness, has WBC 19,000 and procalcitonin 2.32, lactic acid 1.8 Continue Rocephin IV fluids Patient has borderline sepsis criteria so continue to monitor

## 2022-03-23 NOTE — Assessment & Plan Note (Signed)
Patient drinks a couple glasses of wine a day CIWA withdrawal protocol

## 2022-03-24 ENCOUNTER — Encounter: Payer: Self-pay | Admitting: Internal Medicine

## 2022-03-24 ENCOUNTER — Other Ambulatory Visit: Payer: Self-pay

## 2022-03-24 DIAGNOSIS — J189 Pneumonia, unspecified organism: Principal | ICD-10-CM

## 2022-03-24 LAB — BASIC METABOLIC PANEL
Anion gap: 10 (ref 5–15)
BUN: 10 mg/dL (ref 8–23)
CO2: 25 mmol/L (ref 22–32)
Calcium: 8.3 mg/dL — ABNORMAL LOW (ref 8.9–10.3)
Chloride: 102 mmol/L (ref 98–111)
Creatinine, Ser: 0.6 mg/dL (ref 0.44–1.00)
GFR, Estimated: >60 mL/min (ref >60)
Glucose, Bld: 99 mg/dL (ref 70–99)
Potassium: 3.6 mmol/L (ref 3.5–5.1)
Sodium: 137 mmol/L (ref 135–145)

## 2022-03-24 LAB — CBC
HCT: 39.5 % (ref 36.0–46.0)
Hemoglobin: 13.4 g/dL (ref 12.0–15.0)
MCH: 34.4 pg — ABNORMAL HIGH (ref 26.0–34.0)
MCHC: 33.9 g/dL (ref 30.0–36.0)
MCV: 101.3 fL — ABNORMAL HIGH (ref 80.0–100.0)
Platelets: 158 10*3/uL (ref 150–400)
RBC: 3.9 MIL/uL (ref 3.87–5.11)
RDW: 13.3 % (ref 11.5–15.5)
WBC: 8 10*3/uL (ref 4.0–10.5)
nRBC: 0 % (ref 0.0–0.2)

## 2022-03-24 LAB — PROCALCITONIN: Procalcitonin: 4.13 ng/mL

## 2022-03-24 LAB — MAGNESIUM: Magnesium: 1.6 mg/dL — ABNORMAL LOW (ref 1.7–2.4)

## 2022-03-24 MED ORDER — LEVOTHYROXINE SODIUM 50 MCG PO TABS
25.0000 ug | ORAL_TABLET | Freq: Every day | ORAL | Status: DC
  Administered 2022-03-25: 25 ug via ORAL
  Filled 2022-03-24: qty 1

## 2022-03-24 MED ORDER — MAGNESIUM SULFATE 2 GM/50ML IV SOLN
2.0000 g | Freq: Once | INTRAVENOUS | Status: AC
  Administered 2022-03-24: 2 g via INTRAVENOUS
  Filled 2022-03-24: qty 50

## 2022-03-24 MED ORDER — CITALOPRAM HYDROBROMIDE 20 MG PO TABS
40.0000 mg | ORAL_TABLET | Freq: Every day | ORAL | Status: DC
  Administered 2022-03-25: 40 mg via ORAL
  Filled 2022-03-24: qty 2

## 2022-03-24 NOTE — Plan of Care (Signed)
  Problem: Fluid Volume: Goal: Hemodynamic stability will improve Outcome: Progressing   Problem: Clinical Measurements: Goal: Diagnostic test results will improve Outcome: Progressing Goal: Signs and symptoms of infection will decrease Outcome: Progressing   Problem: Respiratory: Goal: Ability to maintain adequate ventilation will improve Outcome: Progressing   Problem: Education: Goal: Knowledge of General Education information will improve Description: Including pain rating scale, medication(s)/side effects and non-pharmacologic comfort measures Outcome: Progressing   Problem: Health Behavior/Discharge Planning: Goal: Ability to manage health-related needs will improve Outcome: Progressing   Problem: Clinical Measurements: Goal: Ability to maintain clinical measurements within normal limits will improve Outcome: Progressing   

## 2022-03-24 NOTE — TOC Initial Note (Addendum)
Transition of Care [facility]) - Initial/Assessment Note    Patient Details  Name: Alyssa Pitts MRN: [id number] Date of Birth: 03/27/1941  [facility]) CM/SW Contact:    Liliana Cline, LCSW Phone Number: 03/24/2022, 10:51 AM  Clinical Narrative:                 CSW spoke with patient regarding DC planning. Patient states she lives with a roommate. Patient states her daughter has also been staying with her to make sure everything is ok at home, states she has been having some issues with roommate but does feel safe returning home at DC. Patient is listed as uninsured, however patient states she has [employer] - CSW sent secure email to Registration to ask them to check and update this.  Patient asked that her daughter's contact info be added to her chart - CSW added info to chart. Patient states she does have a PCP but cannot remember their name right now.  Pharmacy is [facility]. PT recommends home health and RW. Patient is agreeable to a RW being ordered. Ordered RW through [place] with [employer].  Patient states she is already active with [facility] but cannot remember which agency.  Patient states she has been thinking about moving into an ALF and requested resources on this, explained process and encouraged patient to have HHSW added to her HH so they can assist. Also encouraged patient to reach out to DSS.  Patient states she already has a cane at home. Patient states she usually takes Pharmacist, community for transport.  TOC also received a consult for [place] resources. Patient declines resources at this time.  12:50- Per Registration, patient will need to provide her insurance card as they cannot verify coverage. Asked RN to notify patient.  Expected Discharge Plan: Home w Home Health Services Barriers to Discharge: Continued Medical Work up   Patient Goals and CMS Choice Patient states their goals for this hospitalization and ongoing recovery are:: home with home health CMS Medicare.gov  Compare Post Acute Care list provided to:: Patient Choice offered to / list presented to : Patient      Expected Discharge Plan and Services       Living arrangements for the past 2 months: Single Family Home                 DME Arranged: Walker rolling DME Agency: [facility] Date DME Agency Contacted: 03/24/22   Representative spoke with at DME Agency: Leavy Cella            Prior Living Arrangements/Services Living arrangements for the past 2 months: Single Family Home Lives with:: Roommate Patient language and need for interpreter reviewed:: Yes Do you feel safe going back to the place where you live?: Yes      Need for Family Participation in Patient Care: Yes (Comment) Care giver support system in place?: Yes (comment) Current home services: DME Criminal Activity/Legal Involvement Pertinent to Current Situation/Hospitalization: No - Comment as needed  Activities of Daily Living Home Assistive Devices/Equipment: Cane (specify quad or straight) ADL Screening (condition at time of admission) Patient's cognitive ability adequate to safely complete daily activities?: Yes Is the patient deaf or have difficulty hearing?: No Does the patient have difficulty seeing, even when wearing glasses/contacts?: No Does the patient have difficulty concentrating, remembering, or making decisions?: No Patient able to express need for assistance with ADLs?: No Does the patient have difficulty dressing or bathing?: No Independently performs ADLs?: Yes (appropriate for developmental age)  Does the patient have difficulty walking or climbing stairs?: Yes Weakness of Legs: Both Weakness of Arms/Hands: None  Permission Sought/Granted Permission sought to share information with : Facility Medical sales representative, Family Supports Permission granted to share information with : Yes, Verbal Permission Granted     Permission granted to share info w AGENCY: HH & DME  Permission granted to share info  w Relationship: daughter     Emotional Assessment       Orientation: : Oriented to Self, Oriented to Place, Oriented to  Time, Oriented to Situation Alcohol / Substance Use: Not Applicable Psych Involvement: No (comment)  Admission diagnosis:  Urinary tract infection [N39.0] Hypoxia [R09.02] Sepsis with acute hypoxic respiratory failure without septic shock, due to unspecified organism (HCC) [A41.9, R65.20, J96.01] Patient Active Problem List   Diagnosis Date Noted   Asthma without status asthmaticus 03/23/2022   Hypothyroidism 03/23/2022   Alcohol use disorder 03/23/2022   Urinary tract infection 03/23/2022   Generalized weakness 03/23/2022   Hypoxia 03/23/2022   Depression 11/24/2017   Chronic hepatitis C (HCC) 08/09/2015   History of TIAs 07/13/2015   Hypertension 07/13/2015   PCP:  System, Provider Not In Pharmacy:   [facility] - [place], [place] - [address] [place] [address] Phone: [phone] Fax: [phone]     Social Determinants of Health (SDOH) Social History: SDOH Screenings   Food Insecurity: No Food Insecurity (03/24/2022)  Housing: Low Risk  (03/24/2022)  Transportation Needs: No Transportation Needs (03/24/2022)  Utilities: Not At Risk (03/24/2022)  Tobacco Use: Low Risk  (03/24/2022)   SDOH Interventions: Housing Interventions: Intervention Not Indicated   Readmission Risk Interventions    03/24/2022   10:43 AM  Readmission Risk Prevention Plan  Post Dischage Appt Complete  Medication Screening Complete  Transportation Screening Complete

## 2022-03-24 NOTE — Progress Notes (Signed)
  PROGRESS NOTE    Alyssa Pitts  LOV:[id number] DOB: May 04, 1940 DOA: 03/22/2022 PCP: System, Provider Not In  218A/218A-AA  LOS: 1 day   Brief hospital course:   Assessment & Plan: Alyssa Pitts is a 81 y.o. female with medical history significant for Chronic hepatitis C treated from 2020-2022, followed at [facility] liver clinic, HTN, HLD, hypothyroidism, depression, alcohol use disorder, osteoarthritis and history of TIAs who Presents to the ED with a 1 day history of generalized weakness, nausea, difficulty word finding and difficulty with speech.    Acute hypoxemic respiratory failure --She was slightly hypoxic to 89-90 on room air with EMS and arrived on O2 at 2 L.  While in the ED, de-stated to 87% on room air when 2L supplemental O2 removed.  Likely due to PNA. --weaned down to RA today --tx PNA  Atypical PNA --CT chest showed "Mild patchy ground-glass attenuation identified bilaterally.  WBC up to 24, procal up to 5.57.  started on ceftriaxone and azithromycin --cont ceftriaxone and azithromycin   * Urinary tract infection, ruled out --urine cx only 20,000 colonies, pt denied urinary symptoms.  Generalized weakness --PT/OT  Hypothyroidism Continue levothyroxine  Hypertension --hold home Lisinopril  History of TIAs Patient has no focal neurologic deficit CT head nonacute Continue aspirin and statins  Chronic hepatitis C (HCC) Status post treatment, followed by [facility] liver clinic No acute issues suspected  Depression Continue Celexa  Asthma without status asthmaticus Not acutely exacerbated   DVT prophylaxis: Lovenox SQ Code Status: Full code  Family Communication:  Level of care: Telemetry Medical Dispo:   The patient is from: home Anticipated d/c is to: home Anticipated d/c date is: likely tomorrow   Subjective and Interval History:  Weaned to RA today.     Objective: Vitals:   03/24/22 0037 03/24/22 0538 03/24/22 0753 03/24/22 1600  BP: (!)  141/62 131/73 (!) 154/68 139/72  Pulse: 78 82 81 71  Resp: 16 16 16 16   Temp: 99.1 F (37.3 C) 98.6 F (37 C) 98.3 F (36.8 C) 98 F (36.7 C)  TempSrc: Oral Oral Oral Oral  SpO2: 95% 91% 93% 94%  Weight: 88.3 kg     Height:        Intake/Output Summary (Last 24 hours) at 03/24/2022 1706 Last data filed at 03/24/2022 1527 Gross per 24 hour  Intake 425.17 ml  Output 800 ml  Net -374.83 ml   Filed Weights   03/22/22 2352 03/24/22 0037  Weight: 90.9 kg 88.3 kg    Examination:   Constitutional: NAD, AAOx3 HEENT: conjunctivae and lids normal, EOMI CV: No cyanosis.   RESP: normal respiratory effort, on RA Neuro: II - XII grossly intact.   Psych: Normal mood and affect.  Appropriate judgement and reason   Data Reviewed: I have personally reviewed labs and imaging studies  Time spent: 50 minutes  03/26/22, MD [facility] If 7PM-7AM, please contact night-coverage 03/24/2022, 5:06 PM

## 2022-03-25 LAB — BASIC METABOLIC PANEL
Anion gap: 7 (ref 5–15)
BUN: 12 mg/dL (ref 8–23)
CO2: 28 mmol/L (ref 22–32)
Calcium: 8.2 mg/dL — ABNORMAL LOW (ref 8.9–10.3)
Chloride: 104 mmol/L (ref 98–111)
Creatinine, Ser: 0.53 mg/dL (ref 0.44–1.00)
GFR, Estimated: >60 mL/min (ref >60)
Glucose, Bld: 99 mg/dL (ref 70–99)
Potassium: 3.5 mmol/L (ref 3.5–5.1)
Sodium: 139 mmol/L (ref 135–145)

## 2022-03-25 LAB — CBC
HCT: 38.3 % (ref 36.0–46.0)
Hemoglobin: 12.9 g/dL (ref 12.0–15.0)
MCH: 34.2 pg — ABNORMAL HIGH (ref 26.0–34.0)
MCHC: 33.7 g/dL (ref 30.0–36.0)
MCV: 101.6 fL — ABNORMAL HIGH (ref 80.0–100.0)
Platelets: 159 10*3/uL (ref 150–400)
RBC: 3.77 MIL/uL — ABNORMAL LOW (ref 3.87–5.11)
RDW: 13.1 % (ref 11.5–15.5)
WBC: 7.3 10*3/uL (ref 4.0–10.5)
nRBC: 0 % (ref 0.0–0.2)

## 2022-03-25 LAB — URINE CULTURE: Culture: 20000 — AB

## 2022-03-25 LAB — MAGNESIUM: Magnesium: 2 mg/dL (ref 1.7–2.4)

## 2022-03-25 MED ORDER — NAPROXEN SODIUM 220 MG PO TABS: 220.0000 mg | ORAL_TABLET | Freq: Two times a day (BID) | ORAL | Status: AC | PRN

## 2022-03-25 MED ORDER — LEVOFLOXACIN 500 MG PO TABS: 500.0000 mg | ORAL_TABLET | Freq: Every day | ORAL | 0 refills | Status: AC

## 2022-03-25 NOTE — Plan of Care (Signed)
°  Problem: Respiratory: °Goal: Ability to maintain adequate ventilation will improve °Outcome: Progressing °  °

## 2022-03-25 NOTE — TOC Transition Note (Signed)
Transition of Care [facility]) - CM/SW Discharge Note   Patient Details  Name: Alyssa Pitts MRN: [id number] Date of Birth: [birth year]  [facility] CM/SW Contact:  Allena Katz, LCSW Phone Number: 03/25/2022, 11:09 AM   Clinical Narrative:   SW spoke with [facility] who stated they are current with this pt for PT/OT. RW ordered through [employer]. Discharge orders in for Pt. CSW signing off.     Final next level of care: Home w Home Health Services Barriers to Discharge: Barriers Resolved   Patient Goals and CMS Choice CMS Medicare.gov Compare Post Acute Care list provided to:: Patient Choice offered to / list presented to : Patient  Discharge Placement                         Discharge Plan and Services Additional resources added to the After Visit Summary for                  DME Arranged: Walker rolling DME Agency: [facility] Date DME Agency Contacted: 03/24/22   Representative spoke with at DME Agency: Leavy Cella HH Arranged: PT, OT HH Agency: [facility]   Time HH Agency Contacted: 1108 Representative spoke with at [facility] Agency: cheryl  Social Determinants of Health (SDOH) Interventions SDOH Screenings   Food Insecurity: No Food Insecurity (03/24/2022)  Housing: Low Risk  (03/24/2022)  Transportation Needs: No Transportation Needs (03/24/2022)  Utilities: Not At Risk (03/24/2022)  Tobacco Use: Low Risk  (03/24/2022)     Readmission Risk Interventions    03/24/2022   10:43 AM  Readmission Risk Prevention Plan  Post Dischage Appt Complete  Medication Screening Complete  Transportation Screening Complete

## 2022-03-25 NOTE — Discharge Summary (Addendum)
Physician Discharge Summary   Alyssa Pitts  female DOB: 04-27-1940  WCH:[id number]  PCP: System, Provider Not In  Admit date: 03/22/2022 Discharge date: 03/25/2022  Admitted From: home Disposition:  home CODE STATUS: Full code   Hospital Course:  For full details, please see H&P, progress notes, consult notes and ancillary notes.  Briefly,  Alyssa Pitts is a 81 y.o. female with medical history significant for Chronic hepatitis C treated from 2020-2022, followed at [facility] liver clinic, HTN, hypothyroidism, depression, alcohol use disorder, osteoarthritis and history of TIAs who Presented to the ED with a 1 day history of generalized weakness, nausea, difficulty word finding and difficulty with speech.     Acute hypoxemic respiratory failure --She was slightly hypoxic to 89-90 on room air with EMS and arrived on O2 at 2 L.  While in the ED, de-stated to 87% on room air when 2L supplemental O2 removed.  Likely due to PNA. --weaned down to RA prior to discharge.   Atypical PNA --CT chest showed "Mild patchy ground-glass attenuation identified bilaterally.  WBC up to 24, procal up to 5.57.  started on ceftriaxone and azithromycin with resolution of leukocytosis. --Pt received 3 days of ceftriaxone and azithromycin and discharged on 2 more days of Levaquin.   * Urinary tract infection, ruled out --urine cx only 20,000 colonies, pt denied urinary symptoms.   Generalized weakness --PT/OT   Hypothyroidism Continue levothyroxine   Hypertension --cont home Lisinopril   History of TIAs Patient has no focal neurologic deficit CT head nonacute --pt wasn't taking ASA PTA. Continue home statin   Chronic hepatitis C (HCC) Status post treatment, followed by [facility] liver clinic No acute issues suspected   Depression Continue Celexa   Asthma without status asthmaticus Not acutely exacerbated  Sepsis ruled out    Unless noted above, medications under "STOP" list are ones pt  was not taking PTA.  Discharge Diagnoses:  Principal Problem:   Urinary tract infection Active Problems:   Hypoxia   Generalized weakness   Asthma without status asthmaticus   Depression   Chronic hepatitis C (HCC)   History of TIAs   Hypertension   Hypothyroidism   Alcohol use disorder   30 Day Unplanned Readmission Risk Score    Flowsheet Row ED to Hosp-Admission (Current) from 03/22/2022 in [facility]  30 Day Unplanned Readmission Risk Score (%) 8.57 Filed at 03/25/2022 0401       This score is the patient's risk of an unplanned readmission within 30 days of being discharged (0 -100%). The score is based on dignosis, age, lab data, medications, orders, and past utilization.   Low:  0-14.9   Medium: 15-21.9   High: 22-29.9   Extreme: 30 and above         Discharge Instructions:  Allergies as of 03/25/2022       Reactions   Mangifera Indica Hives   Aspirin    Other Reaction(s): Unknown   Bupropion    Side effects   Mango Butter Hives   Mango (not butter). Also caused lip numbness   Statins    Other Reaction(s): Muscle Pain Lipitor & Lovastatin Lipitor & Lovastatin  Patient reports having a problem with higher doses of these statins and is not having a problem at the lower dose of atorvastatin 20mg    Strawberry Extract Rash        Medication List     STOP taking these medications    aspirin 81  MG tablet       TAKE these medications    atorvastatin 20 MG tablet Commonly known as: LIPITOR Take 20 mg by mouth daily.   cetirizine 10 MG tablet Commonly known as: ZYRTEC Take 10 mg by mouth daily.   cholecalciferol 25 MCG (1000 UNIT) tablet Commonly known as: VITAMIN D3 Take 1,000 Units by mouth daily.   citalopram 40 MG tablet Commonly known as: CELEXA Take 40 mg by mouth daily.   levofloxacin 500 MG tablet Commonly known as: Levaquin Take 1 tablet (500 mg total) by mouth daily for 2 days. Start  taking on: March 26, 2022   levothyroxine 25 MCG tablet Commonly known as: SYNTHROID Take 25 mcg by mouth daily before breakfast.   lisinopril 10 MG tablet Commonly known as: ZESTRIL Take 10 mg by mouth daily.   naproxen sodium 220 MG tablet Commonly known as: ALEVE Take 1 tablet (220 mg total) by mouth 2 (two) times daily as needed. Home med. What changed:  when to take this reasons to take this additional instructions               Durable Medical Equipment  (From admission, onward)           Start     Ordered   03/24/22 1105  For home use only DME Walker rolling  Once       Question Answer Comment  Walker: With 5 Inch Wheels   Patient needs a walker to treat with the following condition Weakness      03/24/22 1104             Follow-up Information     Your primary care doctor Follow up in 1 week(s).                  Allergies  Allergen Reactions   Mangifera Indica Hives   Aspirin     Other Reaction(s): Unknown   Bupropion     Side effects   Mango Butter Hives    Mango (not butter). Also caused lip numbness   Statins     Other Reaction(s): Muscle Pain  Lipitor & Lovastatin  Lipitor & Lovastatin  Patient reports having a problem with higher doses of these statins and is not having a problem at the lower dose of atorvastatin    Strawberry Extract Rash     The results of significant diagnostics from this hospitalization (including imaging, microbiology, ancillary and laboratory) are listed below for reference.   Consultations:   Procedures/Studies: CT Angio Chest PE W and/or Wo Contrast  Result Date: 03/23/2022 CLINICAL DATA:  COVID symptoms.  Hypoxia and tachycardia. EXAM: CT ANGIOGRAPHY CHEST WITH CONTRAST TECHNIQUE: Multidetector CT imaging of the chest was performed using the standard protocol during bolus administration of intravenous contrast. Multiplanar CT image reconstructions and MIPs were obtained to evaluate  the vascular anatomy. RADIATION DOSE REDUCTION: This exam was performed according to the departmental dose-optimization program which includes automated exposure control, adjustment of the mA and/or kV according to patient size and/or use of iterative reconstruction technique. CONTRAST:  75mL OMNIPAQUE IOHEXOL 350 MG/ML SOLN COMPARISON:  None Available. FINDINGS: Cardiovascular: Satisfactory opacification of the pulmonary arteries to the segmental level. No evidence of pulmonary embolism. Heart size is normal. Ascending thoracic aorta measures 4.1 cm, image 59/4. Aortic atherosclerosis and coronary artery calcifications. No pericardial effusion. Mediastinum/Nodes: No enlarged mediastinal, hilar, or axillary lymph nodes. Thyroid gland, trachea, and esophagus demonstrate no significant findings. Lungs/Pleura: No pleural fluid or  airspace disease. Areas of subsegmental atelectasis and volume loss are noted within the lingula, right middle lobe and left lower lobe. Mild patchy ground-glass attenuation identified bilaterally, nonspecific. Calcified granuloma seen in the left lower lobe. Central airways appear patent. Upper Abdomen: No acute abnormality. Musculoskeletal: No chest wall abnormality. No acute or significant osseous findings. Review of the MIP images confirms the above findings. IMPRESSION: 1. No evidence for acute pulmonary embolism. 2. Mild patchy ground-glass attenuation identified bilaterally, nonspecific. Findings may reflect mild atypical infection or inflammation. 3. Areas of subsegmental atelectasis and volume loss are noted within the lingula, right middle lobe and left lower lobe. 4. Coronary artery calcifications noted. 5. Ascending thoracic aortic aneurysm measuring 4.1 cm. Recommend annual imaging followup by CTA or MRA. This recommendation follows 2010 ACCF/AHA/AATS/ACR/ASA/SCA/SCAI/SIR/STS/SVM Guidelines for the Diagnosis and Management of Patients with Thoracic Aortic Disease. Circulation.  2010; 121: W258-N277. Aortic aneurysm NOS (ICD10-I71.9) 6.  Aortic Atherosclerosis (ICD10-I70.0). Electronically Signed   By: Signa Kell M.D.   On: 03/23/2022 04:56   CT HEAD WO CONTRAST ( )  Result Date: 03/23/2022 CLINICAL DATA:  Mental status change with unknown cause. COVID symptoms and hypoxia EXAM: CT HEAD WITHOUT CONTRAST TECHNIQUE: Contiguous axial images were obtained from the base of the skull through the vertex without intravenous contrast. RADIATION DOSE REDUCTION: This exam was performed according to the departmental dose-optimization program which includes automated exposure control, adjustment of the mA and/or kV according to patient size and/or use of iterative reconstruction technique. COMPARISON:  03/15/2013 FINDINGS: Brain: No evidence of acute infarction, hemorrhage, hydrocephalus, extra-axial collection or mass lesion/mass effect. Cerebral volume loss and periventricular chronic small vessel ischemia in keeping with age. Vascular: No hyperdense vessel or unexpected calcification. Skull: Normal. Negative for fracture or focal lesion. Sinuses/Orbits: No acute finding. IMPRESSION: Aging brain without acute or reversible finding. Electronically Signed   By: Tiburcio Pea M.D.   On: 03/23/2022 04:07   DG Chest Port 1 View  Result Date: 03/23/2022 CLINICAL DATA:  COVID positive, hypoxemia EXAM: PORTABLE CHEST 1 VIEW COMPARISON:  None Available. FINDINGS: Stable mild elevation of the right hemidiaphragm when compared to CT examination of 11/12/2011. Right cardiophrenic angle opacity relates to prominent pericardial fat. Lungs are otherwise clear. No pneumothorax or pleural effusion. Cardiac size within normal limits. Pulmonary vascularity is normal. No acute bone abnormality. Degenerative changes noted within the right shoulder. IMPRESSION: 1. No radiographic evidence of acute cardiopulmonary disease. Electronically Signed   By: Helyn Numbers M.D.   On: 03/23/2022 00:37       Labs: BNP (last 3 results) No results for input(s): "BNP" in the last 8760 hours. Basic Metabolic Panel: Recent Labs  Lab 03/23/22 0015 03/23/22 0603 03/24/22 0603 03/25/22 0601  NA 139  --  137 139  K 3.5  --  3.6 3.5  CL 102  --  102 104  CO2 23  --  25 28  GLUCOSE 118*  --  99 99  BUN 10  --  10 12  CREATININE 0.70 0.73 0.60 0.53  CALCIUM 9.2  --  8.3* 8.2*  MG  --   --  1.6* 2.0   Liver Function Tests: Recent Labs  Lab 03/23/22 0015  AST 23  ALT 16  ALKPHOS 45  BILITOT 1.2  PROT 7.5  ALBUMIN 4.2   No results for input(s): "LIPASE", "AMYLASE" in the last 168 hours. No results for input(s): "AMMONIA" in the last 168 hours. CBC: Recent Labs  Lab 03/23/22 0015 03/23/22 0603 03/24/22  [facility] 03/25/22 0601  WBC 19.6* 24.0*  23.8* 8.0 7.3  NEUTROABS 17.8* 21.4*  --   --   HGB 15.4* 13.3  13.2 13.4 12.9  HCT 45.4 39.4  39.2 39.5 38.3  MCV 100.9* 103.4*  102.6* 101.3* 101.6*  PLT 219 209  198 158 159   Cardiac Enzymes: No results for input(s): "CKTOTAL", "CKMB", "CKMBINDEX", "TROPONINI" in the last 168 hours. BNP: Invalid input(s): "POCBNP" CBG: No results for input(s): "GLUCAP" in the last 168 hours. D-Dimer No results for input(s): "DDIMER" in the last 72 hours. Hgb A1c No results for input(s): "HGBA1C" in the last 72 hours. Lipid Profile No results for input(s): "CHOL", "HDL", "LDLCALC", "TRIG", "CHOLHDL", "LDLDIRECT" in the last 72 hours. Thyroid function studies No results for input(s): "TSH", "T4TOTAL", "T3FREE", "THYROIDAB" in the last 72 hours.  Invalid input(s): "FREET3" Anemia work up No results for input(s): "VITAMINB12", "FOLATE", "FERRITIN", "TIBC", "IRON", "RETICCTPCT" in the last 72 hours. Urinalysis    Component Value Date/Time   COLORURINE YELLOW (A) 03/23/2022 0400   APPEARANCEUR HAZY (A) 03/23/2022 0400   APPEARANCEUR Hazy 11/11/2011 1935   LABSPEC 1.025 03/23/2022 0400   LABSPEC 1.020 11/11/2011 1935   PHURINE 5.0  03/23/2022 0400   GLUCOSEU NEGATIVE 03/23/2022 0400   GLUCOSEU Negative 11/11/2011 1935   HGBUR SMALL (A) 03/23/2022 0400   BILIRUBINUR NEGATIVE 03/23/2022 0400   BILIRUBINUR Negative 11/11/2011 1935   KETONESUR NEGATIVE 03/23/2022 0400   PROTEINUR NEGATIVE 03/23/2022 0400   NITRITE NEGATIVE 03/23/2022 0400   LEUKOCYTESUR NEGATIVE 03/23/2022 0400   LEUKOCYTESUR Negative 11/11/2011 1935   Sepsis Labs Recent Labs  Lab 03/23/22 0015 03/23/22 0603 03/24/22 0603 03/25/22 0601  WBC 19.6* 24.0*  23.8* 8.0 7.3   Microbiology Recent Results (from the past 240 hour(s))  Blood Culture (routine x 2)     Status: None (Preliminary result)   Collection Time: 03/22/22 11:50 PM   Specimen: BLOOD RIGHT HAND  Result Value Ref Range Status   Specimen Description   Final    BLOOD RIGHT HAND Performed at [facility], [address] [place], [place] [address]    Special Requests   Final    BOTTLES DRAWN AEROBIC AND ANAEROBIC Blood Culture results may not be optimal due to an excessive volume of blood received in culture bottles Performed at [facility], [address], [place], [place] [address]    Culture  Setup Time   Final    NO ORGANISMS SEEN ANAEROBIC BOTTLE ONLY Performed at [facility], [address], [place], [place] [address]    Culture   Final    NO GROWTH 2 DAYS Performed at [facility], [address] [place], [place] [address]    Report Status PENDING  Incomplete  Resp panel by RT-PCR (RSV, Flu A&B, Covid) Anterior Nasal Swab     Status: None   Collection Time: 03/23/22 12:15 AM   Specimen: Anterior Nasal Swab  Result Value Ref Range Status   SARS Coronavirus 2 by RT PCR NEGATIVE NEGATIVE Final    Comment: (NOTE) SARS-CoV-2 target nucleic acids are NOT DETECTED.  The SARS-CoV-2 RNA is generally detectable in upper respiratory specimens during the acute phase of infection. The lowest concentration of SARS-CoV-2 viral copies this  assay can detect is 138 copies/mL. A negative result does not preclude SARS-Cov-2 infection and should not be used as the sole basis for treatment or other patient management decisions. A negative result may occur with  improper specimen  collection/handling, submission of specimen other than nasopharyngeal swab, presence of viral mutation(s) within the areas targeted by this assay, and inadequate number of viral copies(<138 copies/mL). A negative result must be combined with clinical observations, patient history, and epidemiological information. The expected result is Negative.  Fact Sheet for Patients:  BloggerCourse.com  Fact Sheet for Healthcare Providers:  SeriousBroker.it  This test is no t yet approved or cleared by the [place] FDA and  has been authorized for detection and/or diagnosis of SARS-CoV-2 by FDA under an Emergency Use Authorization (EUA). This EUA will remain  in effect (meaning this test can be used) for the duration of the COVID-19 declaration under Section 564(b)(1) of the Act, 21 U.S.C.section 360bbb-3(b)(1), unless the authorization is terminated  or revoked sooner.       Influenza A by PCR NEGATIVE NEGATIVE Final   Influenza B by PCR NEGATIVE NEGATIVE Final    Comment: (NOTE) The Xpert Xpress SARS-CoV-2/FLU/RSV plus assay is intended as an aid in the diagnosis of influenza from Nasopharyngeal swab specimens and should not be used as a sole basis for treatment. Nasal washings and aspirates are unacceptable for Xpert Xpress SARS-CoV-2/FLU/RSV testing.  Fact Sheet for Patients: BloggerCourse.com  Fact Sheet for Healthcare Providers: SeriousBroker.it  This test is not yet approved or cleared by the [place] FDA and has been authorized for detection and/or diagnosis of SARS-CoV-2 by FDA under an Emergency Use Authorization (EUA). This EUA will  remain in effect (meaning this test can be used) for the duration of the COVID-19 declaration under Section 564(b)(1) of the Act, 21 U.S.C. section 360bbb-3(b)(1), unless the authorization is terminated or revoked.     Resp Syncytial Virus by PCR NEGATIVE NEGATIVE Final    Comment: (NOTE) Fact Sheet for Patients: BloggerCourse.com  Fact Sheet for Healthcare Providers: SeriousBroker.it  This test is not yet approved or cleared by the [place] FDA and has been authorized for detection and/or diagnosis of SARS-CoV-2 by FDA under an Emergency Use Authorization (EUA). This EUA will remain in effect (meaning this test can be used) for the duration of the COVID-19 declaration under Section 564(b)(1) of the Act, 21 U.S.C. section 360bbb-3(b)(1), unless the authorization is terminated or revoked.  Performed at [facility], [address] [place], [place] [address]   Blood Culture (routine x 2)     Status: None (Preliminary result)   Collection Time: 03/23/22  3:00 AM   Specimen: BLOOD  Result Value Ref Range Status   Specimen Description BLOOD BLOOD LEFT ARM  Final   Special Requests   Final    BOTTLES DRAWN AEROBIC AND ANAEROBIC Blood Culture results may not be optimal due to an inadequate volume of blood received in culture bottles   Culture   Final    NO GROWTH 2 DAYS Performed at [facility], [address], [place], [place] [address]    Report Status PENDING  Incomplete  Urine Culture     Status: Abnormal (Preliminary result)   Collection Time: 03/23/22  4:00 AM   Specimen: Urine, Clean Catch  Result Value Ref Range Status   Specimen Description   Final    URINE, CLEAN CATCH Performed at [facility], [address] [place], [place] [address]    Special Requests   Final    NONE Performed at [facility], [address], [place], [place] [address]    Culture 20,000  COLONIES/mL ESCHERICHIA COLI (A)  Final   Report Status PENDING  Incomplete     Total time spend on discharging this patient, including the last patient exam, discussing the hospital stay, instructions for ongoing care as it relates to all pertinent caregivers, as well as preparing the medical discharge records, prescriptions, and/or referrals as applicable, is 30 minutes.    Darlin Priestlyina Rasheen Schewe, MD  [facility] 03/25/2022, 7:54 AM

## 2022-03-25 NOTE — Progress Notes (Signed)
Conley Rolls discharged home per MD order. Patient supplied with walker and taxi voucher. Patient transported via wheelchair for discharge.  Madie Reno, RN

## 2022-03-28 LAB — CULTURE, BLOOD (ROUTINE X 2)
Culture: NO GROWTH
Culture: NO GROWTH

## 2022-05-07 DIAGNOSIS — E079 Disorder of thyroid, unspecified: Principal | ICD-10-CM

## 2022-05-07 DIAGNOSIS — E039 Hypothyroidism, unspecified: Principal | ICD-10-CM

## 2022-05-07 MED ORDER — LEVOTHYROXINE 25 MCG TABLET
ORAL_TABLET | 0 refills | 0 days | Status: CP
Start: 2022-05-07 — End: ?

## 2022-06-18 DIAGNOSIS — E039 Hypothyroidism, unspecified: Principal | ICD-10-CM

## 2022-06-18 MED ORDER — LEVOTHYROXINE 25 MCG TABLET
ORAL_TABLET | Freq: Every day | ORAL | 0 refills | 30 days | Status: CP
Start: 2022-06-18 — End: 2023-06-18

## 2022-08-05 DIAGNOSIS — E039 Hypothyroidism, unspecified: Principal | ICD-10-CM

## 2022-08-05 MED ORDER — LEVOTHYROXINE 25 MCG TABLET
ORAL_TABLET | 0 refills | 0 days
Start: 2022-08-05 — End: ?

## 2022-08-06 MED ORDER — LEVOTHYROXINE 25 MCG TABLET
ORAL_TABLET | 0 refills | 0 days
Start: 2022-08-06 — End: ?

## 2022-09-04 DIAGNOSIS — E039 Hypothyroidism, unspecified: Principal | ICD-10-CM

## 2022-09-04 MED ORDER — LEVOTHYROXINE 25 MCG TABLET
ORAL_TABLET | Freq: Every day | ORAL | 0 refills | 30 days | Status: CP
Start: 2022-09-04 — End: 2023-09-04

## 2022-09-17 ENCOUNTER — Ambulatory Visit: Admit: 2022-09-17 | Payer: MEDICARE

## 2022-09-19 DIAGNOSIS — I10 Essential (primary) hypertension: Principal | ICD-10-CM

## 2022-09-19 MED ORDER — LISINOPRIL 10 MG TABLET
ORAL_TABLET | Freq: Every day | ORAL | 1 refills | 90 days | Status: CP
Start: 2022-09-19 — End: 2023-09-19

## 2022-10-08 DIAGNOSIS — E039 Hypothyroidism, unspecified: Principal | ICD-10-CM

## 2022-10-08 MED ORDER — LEVOTHYROXINE 25 MCG TABLET
ORAL_TABLET | Freq: Every day | ORAL | 0 refills | 90 days | Status: CP
Start: 2022-10-08 — End: 2023-10-08

## 2022-12-24 ENCOUNTER — Institutional Professional Consult (permissible substitution): Admit: 2022-12-24 | Discharge: 2022-12-25 | Payer: MEDICARE

## 2022-12-24 DIAGNOSIS — R5381 Other malaise: Principal | ICD-10-CM

## 2022-12-24 DIAGNOSIS — Z7409 Other reduced mobility: Principal | ICD-10-CM

## 2022-12-31 ENCOUNTER — Institutional Professional Consult (permissible substitution): Admit: 2022-12-31 | Discharge: 2023-01-01 | Payer: MEDICARE

## 2022-12-31 DIAGNOSIS — J40 Bronchitis, not specified as acute or chronic: Principal | ICD-10-CM

## 2022-12-31 MED ORDER — WIXELA INHUB 100 MCG-50 MCG/DOSE POWDER FOR INHALATION
Freq: Two times a day (BID) | RESPIRATORY_TRACT | 1 refills | 0 days | Status: CP
Start: 2022-12-31 — End: 2023-12-31

## 2022-12-31 MED ORDER — AZITHROMYCIN 250 MG TABLET
ORAL_TABLET | 0 refills | 5 days | Status: CP
Start: 2022-12-31 — End: 2023-01-05

## 2023-01-15 DIAGNOSIS — L304 Erythema intertrigo: Principal | ICD-10-CM

## 2023-01-15 MED ORDER — NYSTATIN 100,000 UNIT/GRAM TOPICAL CREAM
0 refills | 0 days | Status: CP
Start: 2023-01-15 — End: ?

## 2023-02-12 DIAGNOSIS — F32A Depression, unspecified depression type: Principal | ICD-10-CM

## 2023-02-12 DIAGNOSIS — I10 Essential (primary) hypertension: Principal | ICD-10-CM

## 2023-02-12 DIAGNOSIS — L304 Erythema intertrigo: Principal | ICD-10-CM

## 2023-02-12 DIAGNOSIS — E785 Hyperlipidemia, unspecified: Principal | ICD-10-CM

## 2023-02-12 MED ORDER — ATORVASTATIN 20 MG TABLET
ORAL_TABLET | Freq: Every evening | ORAL | 1 refills | 90 days | Status: CP
Start: 2023-02-12 — End: 2024-02-12

## 2023-02-12 MED ORDER — NYSTATIN 100,000 UNIT/GRAM TOPICAL CREAM
Freq: Two times a day (BID) | TOPICAL | 5 refills | 0 days | Status: CP
Start: 2023-02-12 — End: 2024-02-12

## 2023-02-12 MED ORDER — CITALOPRAM 40 MG TABLET
ORAL_TABLET | Freq: Every day | ORAL | 1 refills | 90 days | Status: CP
Start: 2023-02-12 — End: 2024-02-12

## 2023-02-12 MED ORDER — LISINOPRIL 10 MG TABLET
ORAL_TABLET | Freq: Every day | ORAL | 1 refills | 90 days | Status: CP
Start: 2023-02-12 — End: 2024-02-12

## 2023-02-20 ENCOUNTER — Institutional Professional Consult (permissible substitution): Admit: 2023-02-20 | Discharge: 2023-02-21 | Payer: MEDICARE

## 2023-02-20 DIAGNOSIS — F1010 Alcohol abuse, uncomplicated: Principal | ICD-10-CM

## 2023-02-20 DIAGNOSIS — I10 Essential (primary) hypertension: Principal | ICD-10-CM

## 2023-02-20 DIAGNOSIS — E7800 Pure hypercholesterolemia, unspecified: Principal | ICD-10-CM

## 2023-02-20 DIAGNOSIS — Z8673 Personal history of transient ischemic attack (TIA), and cerebral infarction without residual deficits: Principal | ICD-10-CM

## 2023-02-20 DIAGNOSIS — R3915 Urgency of urination: Principal | ICD-10-CM

## 2023-02-20 DIAGNOSIS — E079 Disorder of thyroid, unspecified: Principal | ICD-10-CM

## 2023-02-20 DIAGNOSIS — F3289 Other specified depressive episodes: Principal | ICD-10-CM

## 2023-02-20 DIAGNOSIS — E039 Hypothyroidism, unspecified: Principal | ICD-10-CM

## 2023-02-20 MED ORDER — LEVOTHYROXINE 25 MCG TABLET
ORAL_TABLET | Freq: Every day | ORAL | 1 refills | 90 days | Status: CP
Start: 2023-02-20 — End: 2024-02-20

## 2023-02-20 MED ORDER — OXYBUTYNIN CHLORIDE ER 5 MG TABLET,EXTENDED RELEASE 24 HR
ORAL_TABLET | Freq: Every day | ORAL | 1 refills | 30 days | Status: CP
Start: 2023-02-20 — End: 2024-02-20

## 2023-03-02 DEATH — deceased
